# Patient Record
Sex: Male | Born: 1950 | Race: White | Hispanic: No | Marital: Married | Smoking: Never smoker
Health system: Southern US, Community
[De-identification: ages and names within clinical notes are randomized; demographics above are authoritative.]

## PROBLEM LIST (undated history)

## (undated) DIAGNOSIS — M1990 Unspecified osteoarthritis, unspecified site: Secondary | ICD-10-CM

## (undated) DIAGNOSIS — T4145XA Adverse effect of unspecified anesthetic, initial encounter: Secondary | ICD-10-CM

## (undated) DIAGNOSIS — I2510 Atherosclerotic heart disease of native coronary artery without angina pectoris: Secondary | ICD-10-CM

## (undated) DIAGNOSIS — Z87442 Personal history of urinary calculi: Secondary | ICD-10-CM

## (undated) DIAGNOSIS — K219 Gastro-esophageal reflux disease without esophagitis: Secondary | ICD-10-CM

## (undated) DIAGNOSIS — I10 Essential (primary) hypertension: Secondary | ICD-10-CM

## (undated) DIAGNOSIS — Z9889 Other specified postprocedural states: Secondary | ICD-10-CM

## (undated) DIAGNOSIS — T8859XA Other complications of anesthesia, initial encounter: Secondary | ICD-10-CM

## (undated) DIAGNOSIS — G4730 Sleep apnea, unspecified: Secondary | ICD-10-CM

## (undated) DIAGNOSIS — J45909 Unspecified asthma, uncomplicated: Secondary | ICD-10-CM

## (undated) DIAGNOSIS — E119 Type 2 diabetes mellitus without complications: Secondary | ICD-10-CM

## (undated) DIAGNOSIS — E7800 Pure hypercholesterolemia, unspecified: Secondary | ICD-10-CM

## (undated) DIAGNOSIS — R112 Nausea with vomiting, unspecified: Secondary | ICD-10-CM

## (undated) HISTORY — PX: CARDIAC CATHETERIZATION: SHX172

## (undated) HISTORY — PX: SHOULDER ARTHROSCOPY W/ ROTATOR CUFF REPAIR: SHX2400

---

## 1996-05-08 HISTORY — PX: MENISCUS REPAIR: SHX5179

## 2017-03-09 ENCOUNTER — Other Ambulatory Visit: Payer: Self-pay | Admitting: Orthopedic Surgery

## 2017-03-09 DIAGNOSIS — M1712 Unilateral primary osteoarthritis, left knee: Secondary | ICD-10-CM

## 2017-03-15 ENCOUNTER — Ambulatory Visit
Admission: RE | Admit: 2017-03-15 | Discharge: 2017-03-15 | Disposition: A | Discharge status: Home / Self Care | Payer: Medicare HMO | Source: Ambulatory Visit | Attending: Orthopedic Surgery | Admitting: Orthopedic Surgery

## 2017-03-15 DIAGNOSIS — M25462 Effusion, left knee: Secondary | ICD-10-CM | POA: Diagnosis not present

## 2017-03-15 DIAGNOSIS — M1712 Unilateral primary osteoarthritis, left knee: Secondary | ICD-10-CM | POA: Insufficient documentation

## 2017-03-15 DIAGNOSIS — M7122 Synovial cyst of popliteal space [Baker], left knee: Secondary | ICD-10-CM | POA: Diagnosis not present

## 2017-03-28 ENCOUNTER — Other Ambulatory Visit: Payer: Self-pay

## 2017-03-28 ENCOUNTER — Encounter
Admission: RE | Admit: 2017-03-28 | Discharge: 2017-03-28 | Disposition: A | Discharge status: Home / Self Care | Payer: Medicare HMO | Source: Ambulatory Visit | Attending: Orthopedic Surgery | Admitting: Orthopedic Surgery

## 2017-03-28 ENCOUNTER — Other Ambulatory Visit: Payer: Medicare HMO

## 2017-03-28 DIAGNOSIS — I10 Essential (primary) hypertension: Secondary | ICD-10-CM | POA: Insufficient documentation

## 2017-03-28 DIAGNOSIS — E119 Type 2 diabetes mellitus without complications: Secondary | ICD-10-CM | POA: Diagnosis not present

## 2017-03-28 DIAGNOSIS — Z01818 Encounter for other preprocedural examination: Secondary | ICD-10-CM | POA: Insufficient documentation

## 2017-03-28 DIAGNOSIS — I2510 Atherosclerotic heart disease of native coronary artery without angina pectoris: Secondary | ICD-10-CM | POA: Insufficient documentation

## 2017-03-28 HISTORY — DX: Atherosclerotic heart disease of native coronary artery without angina pectoris: I25.10

## 2017-03-28 HISTORY — DX: Personal history of urinary calculi: Z87.442

## 2017-03-28 HISTORY — DX: Other specified postprocedural states: Z98.890

## 2017-03-28 HISTORY — DX: Type 2 diabetes mellitus without complications: E11.9

## 2017-03-28 HISTORY — DX: Unspecified asthma, uncomplicated: J45.909

## 2017-03-28 HISTORY — DX: Essential (primary) hypertension: I10

## 2017-03-28 HISTORY — DX: Sleep apnea, unspecified: G47.30

## 2017-03-28 HISTORY — DX: Adverse effect of unspecified anesthetic, initial encounter: T41.45XA

## 2017-03-28 HISTORY — DX: Other specified postprocedural states: R11.2

## 2017-03-28 HISTORY — DX: Pure hypercholesterolemia, unspecified: E78.00

## 2017-03-28 HISTORY — DX: Other complications of anesthesia, initial encounter: T88.59XA

## 2017-03-28 HISTORY — DX: Gastro-esophageal reflux disease without esophagitis: K21.9

## 2017-03-28 HISTORY — DX: Unspecified osteoarthritis, unspecified site: M19.90

## 2017-03-28 LAB — CBC
HEMATOCRIT: 48.7 % (ref 40.0–52.0)
Hemoglobin: 16.2 g/dL (ref 13.0–18.0)
MCH: 31 pg (ref 26.0–34.0)
MCHC: 33.3 g/dL (ref 32.0–36.0)
MCV: 92.9 fL (ref 80.0–100.0)
Platelets: 256 10*3/uL (ref 150–440)
RBC: 5.24 MIL/uL (ref 4.40–5.90)
RDW: 13.7 % (ref 11.5–14.5)
WBC: 7 10*3/uL (ref 3.8–10.6)

## 2017-03-28 LAB — URINALYSIS, ROUTINE W REFLEX MICROSCOPIC
BILIRUBIN URINE: NEGATIVE
Bacteria, UA: NONE SEEN
Hgb urine dipstick: NEGATIVE
KETONES UR: 5 mg/dL — AB
LEUKOCYTES UA: NEGATIVE
NITRITE: NEGATIVE
PH: 5 (ref 5.0–8.0)
Protein, ur: 30 mg/dL — AB
RBC / HPF: NONE SEEN RBC/hpf (ref 0–5)
SPECIFIC GRAVITY, URINE: 1.032 — AB (ref 1.005–1.030)

## 2017-03-28 LAB — BASIC METABOLIC PANEL
ANION GAP: 13 (ref 5–15)
BUN: 19 mg/dL (ref 6–20)
CHLORIDE: 103 mmol/L (ref 101–111)
CO2: 20 mmol/L — AB (ref 22–32)
Calcium: 8.8 mg/dL — ABNORMAL LOW (ref 8.9–10.3)
Creatinine, Ser: 0.83 mg/dL (ref 0.61–1.24)
GFR calc non Af Amer: >60 mL/min (ref >60)
GLUCOSE: 192 mg/dL — AB (ref 65–99)
Potassium: 4.2 mmol/L (ref 3.5–5.1)
Sodium: 136 mmol/L (ref 135–145)

## 2017-03-28 LAB — TYPE AND SCREEN
ABO/RH(D): A NEG
Antibody Screen: NEGATIVE

## 2017-03-28 LAB — SURGICAL PCR SCREEN
MRSA, PCR: NEGATIVE
STAPHYLOCOCCUS AUREUS: NEGATIVE

## 2017-03-28 LAB — PROTIME-INR
INR: 0.95
Prothrombin Time: 12.6 seconds (ref 11.4–15.2)

## 2017-03-28 LAB — SEDIMENTATION RATE: Sed Rate: 1 mm/hr (ref 0–20)

## 2017-03-28 LAB — APTT: APTT: 33 s (ref 24–36)

## 2017-03-28 NOTE — Patient Instructions (Signed)
  Your procedure is scheduled JX:BJYNWGNon:Tuesday Dec. 4, 2018. Report to Same Day Surgery. To find out your arrival time please call [phone] between 1PM - 3PM on Monday Dec. 3, 2018 .  Remember: Instructions that are not followed completely may result in serious medical risk, up to and including death, or upon the discretion of your surgeon and anesthesiologist your surgery may need to be rescheduled.    _x___ 1. Do not eat food after midnight night prior to surgery. No gum   chewing or hard candies, snacks or breakfast.    May drink the following: water up until 2 hours prior to ARRIVAL time.     _x___ 2. No Alcohol for 24 hours before or after surgery.   ____ 3. Bring all medications with you on the day of surgery if instructed.    __x__ 4. Notify your doctor if there is any change in your medical condition     (cold, fever, infections).    _____ 5.   Do Not Smoke or use e-cigarettes For 24 Hours Prior to Your   Surgery.  Do not use any chewable tobacco products for at least 6   hours prior to  surgery.                      Do not wear jewelry, make-up, hairpins, clips or nail polish.  Do not wear lotions, powders, or perfumes.   Do not shave 48 hours prior to surgery. Men may shave face and neck.  Do not bring valuables to the hospital.    [facility] is not responsible for any belongings or valuables.               Contacts, dentures or bridgework may not be worn into surgery.  Leave your suitcase in the car. After surgery it may be brought to your room.  For patients admitted to the hospital, discharge time is determined by your  treatment team.   Patients discharged the day of surgery will not be allowed to drive home.    Please read over the following fact sheets that you were given:   [facility]  _x___ Take these medicines the morning of surgery with A SIP OF WATER:    1. amLODipine (NORVASC)  2. metoprolol succinate (TOPROL-XL)   3. omeprazole  (PRILOSEC) also take an extra dose at bedtime the night prior to surgery       ____ Fleet Enema (as directed)   _x___ Use CHG Soap as directed on instruction sheet  _x___ Use inhalers on the day of surgery and bring to hospital day of surgery  _x___ Stop metformin 2 days prior to surgery    ____ Take 1/2 of usual insulin dose the night before surgery and none on the morning of  surgery.   __x_ Stop aspirin as instructed by your cardiologist.  __x__ Stop Anti-inflammatories such as Advil, Aleve, Ibuprofen, Motrin, Naproxen,  Naprosyn, Goodies powders or aspirin products. OK to take Tylenol.   _x___ Stop supplements: Omega-3  until after surgery.    __x__ Bring C-Pap to the hospital.

## 2017-03-29 LAB — URINE CULTURE: Culture: NO GROWTH

## 2017-04-09 MED ORDER — TRANEXAMIC ACID 1000 MG/10ML IV SOLN
1000.0000 mg | INTRAVENOUS | Status: AC
  Administered 2017-04-10: 1000 mg via INTRAVENOUS
  Filled 2017-04-09: qty 10

## 2017-04-10 ENCOUNTER — Inpatient Hospital Stay: Payer: Medicare HMO | Admitting: Certified Registered"

## 2017-04-10 ENCOUNTER — Encounter: Payer: Self-pay | Admitting: *Deleted

## 2017-04-10 ENCOUNTER — Encounter
Admission: RE | Disposition: A | Discharge status: Home Health | Payer: Self-pay | Source: Ambulatory Visit | Attending: Orthopedic Surgery

## 2017-04-10 ENCOUNTER — Inpatient Hospital Stay
Admission: RE | Admit: 2017-04-10 | Discharge: 2017-04-13 | DRG: 470 | Disposition: A | Discharge status: Home Health | Payer: Medicare HMO | Source: Ambulatory Visit | Attending: Orthopedic Surgery | Admitting: Orthopedic Surgery

## 2017-04-10 ENCOUNTER — Inpatient Hospital Stay: Payer: Medicare HMO

## 2017-04-10 ENCOUNTER — Other Ambulatory Visit: Payer: Self-pay

## 2017-04-10 DIAGNOSIS — I10 Essential (primary) hypertension: Secondary | ICD-10-CM | POA: Diagnosis present

## 2017-04-10 DIAGNOSIS — G4730 Sleep apnea, unspecified: Secondary | ICD-10-CM | POA: Diagnosis present

## 2017-04-10 DIAGNOSIS — I2510 Atherosclerotic heart disease of native coronary artery without angina pectoris: Secondary | ICD-10-CM | POA: Diagnosis present

## 2017-04-10 DIAGNOSIS — Z79899 Other long term (current) drug therapy: Secondary | ICD-10-CM | POA: Diagnosis not present

## 2017-04-10 DIAGNOSIS — E7800 Pure hypercholesterolemia, unspecified: Secondary | ICD-10-CM | POA: Diagnosis present

## 2017-04-10 DIAGNOSIS — M1712 Unilateral primary osteoarthritis, left knee: Principal | ICD-10-CM | POA: Diagnosis present

## 2017-04-10 DIAGNOSIS — J45909 Unspecified asthma, uncomplicated: Secondary | ICD-10-CM | POA: Diagnosis present

## 2017-04-10 DIAGNOSIS — E119 Type 2 diabetes mellitus without complications: Secondary | ICD-10-CM | POA: Diagnosis present

## 2017-04-10 DIAGNOSIS — G8918 Other acute postprocedural pain: Secondary | ICD-10-CM

## 2017-04-10 DIAGNOSIS — K219 Gastro-esophageal reflux disease without esophagitis: Secondary | ICD-10-CM | POA: Diagnosis present

## 2017-04-10 DIAGNOSIS — M25562 Pain in left knee: Secondary | ICD-10-CM | POA: Diagnosis present

## 2017-04-10 HISTORY — PX: TOTAL KNEE ARTHROPLASTY: SHX125

## 2017-04-10 LAB — CREATININE, SERUM
CREATININE: 0.9 mg/dL (ref 0.61–1.24)
GFR calc Af Amer: >60 mL/min (ref >60)

## 2017-04-10 LAB — CBC
HCT: 45.7 % (ref 40.0–52.0)
HEMOGLOBIN: 15.2 g/dL (ref 13.0–18.0)
MCH: 30.7 pg (ref 26.0–34.0)
MCHC: 33.3 g/dL (ref 32.0–36.0)
MCV: 92.3 fL (ref 80.0–100.0)
PLATELETS: 235 10*3/uL (ref 150–440)
RBC: 4.95 MIL/uL (ref 4.40–5.90)
RDW: 14.2 % (ref 11.5–14.5)
WBC: 11 10*3/uL — ABNORMAL HIGH (ref 3.8–10.6)

## 2017-04-10 LAB — GLUCOSE, CAPILLARY
GLUCOSE-CAPILLARY: 167 mg/dL — AB (ref 65–99)
Glucose-Capillary: 177 mg/dL — ABNORMAL HIGH (ref 65–99)

## 2017-04-10 LAB — ABO/RH: ABO/RH(D): A NEG

## 2017-04-10 SURGERY — ARTHROPLASTY, KNEE, TOTAL
Anesthesia: Spinal | Site: Knee | Laterality: Left | Wound class: Clean

## 2017-04-10 MED ORDER — NEOMYCIN-POLYMYXIN B GU 40-200000 IR SOLN
Status: AC
  Filled 2017-04-10: qty 20

## 2017-04-10 MED ORDER — CEFAZOLIN SODIUM-DEXTROSE 2-4 GM/100ML-% IV SOLN
INTRAVENOUS | Status: AC
  Filled 2017-04-10: qty 100

## 2017-04-10 MED ORDER — BISACODYL 10 MG RE SUPP
10.0000 mg | Freq: Every day | RECTAL | Status: DC | PRN
  Administered 2017-04-13: 10 mg via RECTAL
  Filled 2017-04-10: qty 1

## 2017-04-10 MED ORDER — METOCLOPRAMIDE HCL 5 MG/ML IJ SOLN: 5.0000 mg | Freq: Three times a day (TID) | INTRAMUSCULAR | Status: DC | PRN

## 2017-04-10 MED ORDER — MENTHOL 3 MG MT LOZG
1.0000 | LOZENGE | OROMUCOSAL | Status: DC | PRN
  Filled 2017-04-10: qty 9

## 2017-04-10 MED ORDER — ZOLPIDEM TARTRATE 5 MG PO TABS
5.0000 mg | ORAL_TABLET | Freq: Every evening | ORAL | Status: DC | PRN
  Administered 2017-04-10 – 2017-04-12 (×3): 5 mg via ORAL
  Filled 2017-04-10 (×3): qty 1

## 2017-04-10 MED ORDER — DEXTROSE 5 % IV SOLN
2.0000 g | Freq: Four times a day (QID) | INTRAVENOUS | Status: AC
  Administered 2017-04-10 (×2): 2 g via INTRAVENOUS
  Filled 2017-04-10 (×3): qty 2000

## 2017-04-10 MED ORDER — SODIUM CHLORIDE 0.9 % IV SOLN
INTRAVENOUS | Status: DC
  Administered 2017-04-10 – 2017-04-11 (×2): via INTRAVENOUS

## 2017-04-10 MED ORDER — BUPIVACAINE HCL (PF) 0.5 % IJ SOLN
INTRAMUSCULAR | Status: AC
  Filled 2017-04-10: qty 10

## 2017-04-10 MED ORDER — PHENOL 1.4 % MT LIQD
1.0000 | OROMUCOSAL | Status: DC | PRN
  Filled 2017-04-10: qty 177

## 2017-04-10 MED ORDER — PROPOFOL 500 MG/50ML IV EMUL
INTRAVENOUS | Status: DC | PRN
  Administered 2017-04-10: 25 ug/kg/min via INTRAVENOUS

## 2017-04-10 MED ORDER — EPINEPHRINE PF 1 MG/ML IJ SOLN
INTRAMUSCULAR | Status: AC
  Filled 2017-04-10: qty 1

## 2017-04-10 MED ORDER — ATORVASTATIN CALCIUM 20 MG PO TABS
80.0000 mg | ORAL_TABLET | Freq: Every day | ORAL | Status: DC
  Administered 2017-04-10 – 2017-04-12 (×3): 80 mg via ORAL
  Filled 2017-04-10 (×2): qty 4

## 2017-04-10 MED ORDER — LISINOPRIL 20 MG PO TABS
40.0000 mg | ORAL_TABLET | Freq: Every day | ORAL | Status: DC
  Administered 2017-04-11 – 2017-04-13 (×3): 40 mg via ORAL
  Filled 2017-04-10 (×3): qty 2

## 2017-04-10 MED ORDER — MAGNESIUM HYDROXIDE 400 MG/5ML PO SUSP
30.0000 mL | Freq: Every day | ORAL | Status: DC | PRN
  Administered 2017-04-11 – 2017-04-12 (×2): 30 mL via ORAL
  Filled 2017-04-10 (×2): qty 30

## 2017-04-10 MED ORDER — ONDANSETRON HCL 4 MG PO TABS
4.0000 mg | ORAL_TABLET | Freq: Four times a day (QID) | ORAL | Status: DC | PRN
  Administered 2017-04-11: 4 mg via ORAL
  Filled 2017-04-10: qty 1

## 2017-04-10 MED ORDER — METOPROLOL SUCCINATE ER 25 MG PO TB24
12.5000 mg | ORAL_TABLET | Freq: Every day | ORAL | Status: DC
  Administered 2017-04-11 – 2017-04-13 (×3): 12.5 mg via ORAL
  Filled 2017-04-10 (×3): qty 1

## 2017-04-10 MED ORDER — GLIPIZIDE ER 10 MG PO TB24
10.0000 mg | ORAL_TABLET | Freq: Every day | ORAL | Status: DC
  Administered 2017-04-11 – 2017-04-13 (×3): 10 mg via ORAL
  Filled 2017-04-10 (×3): qty 1

## 2017-04-10 MED ORDER — VITAMIN C 500 MG PO TABS
500.0000 mg | ORAL_TABLET | Freq: Every day | ORAL | Status: DC
  Administered 2017-04-10 – 2017-04-13 (×4): 500 mg via ORAL
  Filled 2017-04-10 (×4): qty 1

## 2017-04-10 MED ORDER — SODIUM CHLORIDE 0.9 % IV SOLN
INTRAVENOUS | Status: DC
  Administered 2017-04-10 (×2): via INTRAVENOUS

## 2017-04-10 MED ORDER — MIDAZOLAM HCL 2 MG/2ML IJ SOLN
INTRAMUSCULAR | Status: AC
  Filled 2017-04-10: qty 2

## 2017-04-10 MED ORDER — LIDOCAINE HCL (PF) 2 % IJ SOLN
INTRAMUSCULAR | Status: DC | PRN
  Administered 2017-04-10: 25 mg

## 2017-04-10 MED ORDER — GLIPIZIDE ER 5 MG PO TB24
5.0000 mg | ORAL_TABLET | Freq: Every day | ORAL | Status: DC
  Administered 2017-04-10 – 2017-04-12 (×3): 5 mg via ORAL
  Filled 2017-04-10 (×4): qty 1

## 2017-04-10 MED ORDER — GLIPIZIDE ER 5 MG PO TB24: 5.0000 mg | ORAL_TABLET | ORAL | Status: DC

## 2017-04-10 MED ORDER — METOCLOPRAMIDE HCL 10 MG PO TABS: 5.0000 mg | ORAL_TABLET | Freq: Three times a day (TID) | ORAL | Status: DC | PRN

## 2017-04-10 MED ORDER — OMEGA-3-ACID ETHYL ESTERS 1 G PO CAPS
1.0000 g | ORAL_CAPSULE | Freq: Every day | ORAL | Status: DC
  Administered 2017-04-11 – 2017-04-13 (×3): 1 g via ORAL
  Filled 2017-04-10 (×3): qty 1

## 2017-04-10 MED ORDER — FENTANYL CITRATE (PF) 100 MCG/2ML IJ SOLN
INTRAMUSCULAR | Status: DC | PRN
  Administered 2017-04-10 (×2): 50 ug via INTRAVENOUS

## 2017-04-10 MED ORDER — ALBUTEROL SULFATE (2.5 MG/3ML) 0.083% IN NEBU: 2.5000 mg | INHALATION_SOLUTION | RESPIRATORY_TRACT | Status: DC | PRN

## 2017-04-10 MED ORDER — DOCUSATE SODIUM 100 MG PO CAPS
100.0000 mg | ORAL_CAPSULE | Freq: Two times a day (BID) | ORAL | Status: DC
  Administered 2017-04-10 – 2017-04-13 (×7): 100 mg via ORAL
  Filled 2017-04-10 (×7): qty 1

## 2017-04-10 MED ORDER — METHOCARBAMOL 500 MG PO TABS
500.0000 mg | ORAL_TABLET | Freq: Four times a day (QID) | ORAL | Status: DC | PRN
  Administered 2017-04-12: 500 mg via ORAL
  Filled 2017-04-10: qty 1

## 2017-04-10 MED ORDER — FENTANYL CITRATE (PF) 100 MCG/2ML IJ SOLN
INTRAMUSCULAR | Status: AC
  Filled 2017-04-10: qty 2

## 2017-04-10 MED ORDER — HYDROCODONE-ACETAMINOPHEN 5-325 MG PO TABS
1.0000 | ORAL_TABLET | ORAL | Status: DC | PRN
  Administered 2017-04-10 – 2017-04-12 (×8): 1 via ORAL
  Filled 2017-04-10 (×8): qty 1

## 2017-04-10 MED ORDER — METHOCARBAMOL 1000 MG/10ML IJ SOLN
500.0000 mg | Freq: Four times a day (QID) | INTRAVENOUS | Status: DC | PRN
  Filled 2017-04-10: qty 5

## 2017-04-10 MED ORDER — MAGNESIUM CITRATE PO SOLN
1.0000 | Freq: Once | ORAL | Status: DC | PRN
Start: 2017-04-10 — End: 2017-04-13
  Filled 2017-04-10: qty 296

## 2017-04-10 MED ORDER — MORPHINE SULFATE 10 MG/ML IJ SOLN
INTRAMUSCULAR | Status: DC | PRN
  Administered 2017-04-10: 10 mg

## 2017-04-10 MED ORDER — MIDAZOLAM HCL 5 MG/5ML IJ SOLN
INTRAMUSCULAR | Status: DC | PRN
Start: 2017-04-10 — End: 2017-04-10
  Administered 2017-04-10 (×2): 2 mg via INTRAVENOUS

## 2017-04-10 MED ORDER — DEXTROSE 5 % IV SOLN
2.0000 g | Freq: Once | INTRAVENOUS | Status: DC
  Filled 2017-04-10: qty 20

## 2017-04-10 MED ORDER — METFORMIN HCL 500 MG PO TABS
1000.0000 mg | ORAL_TABLET | Freq: Two times a day (BID) | ORAL | Status: DC
  Administered 2017-04-10 – 2017-04-13 (×6): 1000 mg via ORAL
  Filled 2017-04-10 (×7): qty 2

## 2017-04-10 MED ORDER — PROPOFOL 10 MG/ML IV BOLUS
INTRAVENOUS | Status: DC | PRN
  Administered 2017-04-10: 20 mg via INTRAVENOUS

## 2017-04-10 MED ORDER — ALBUTEROL SULFATE HFA 108 (90 BASE) MCG/ACT IN AERS
2.0000 | INHALATION_SPRAY | RESPIRATORY_TRACT | Status: DC | PRN
Start: 2017-04-10 — End: 2017-04-10

## 2017-04-10 MED ORDER — AMLODIPINE BESYLATE 5 MG PO TABS
5.0000 mg | ORAL_TABLET | Freq: Every day | ORAL | Status: DC
  Administered 2017-04-11 – 2017-04-13 (×3): 5 mg via ORAL
  Filled 2017-04-10 (×3): qty 1

## 2017-04-10 MED ORDER — FENTANYL CITRATE (PF) 100 MCG/2ML IJ SOLN: 25.0000 ug | INTRAMUSCULAR | Status: DC | PRN

## 2017-04-10 MED ORDER — LORATADINE 10 MG PO TABS
10.0000 mg | ORAL_TABLET | Freq: Every day | ORAL | Status: DC
  Administered 2017-04-10 – 2017-04-13 (×4): 10 mg via ORAL
  Filled 2017-04-10 (×4): qty 1

## 2017-04-10 MED ORDER — GLYCOPYRROLATE 0.2 MG/ML IJ SOLN
INTRAMUSCULAR | Status: AC
  Filled 2017-04-10: qty 1

## 2017-04-10 MED ORDER — ONDANSETRON HCL 4 MG/2ML IJ SOLN
4.0000 mg | Freq: Four times a day (QID) | INTRAMUSCULAR | Status: DC | PRN
  Administered 2017-04-10: 4 mg via INTRAVENOUS
  Filled 2017-04-10: qty 2

## 2017-04-10 MED ORDER — ONDANSETRON HCL 4 MG/2ML IJ SOLN: 4.0000 mg | Freq: Once | INTRAMUSCULAR | Status: DC | PRN

## 2017-04-10 MED ORDER — CEFAZOLIN SODIUM-DEXTROSE 2-4 GM/100ML-% IV SOLN
2.0000 g | Freq: Four times a day (QID) | INTRAVENOUS | Status: DC
  Filled 2017-04-10 (×2): qty 100

## 2017-04-10 MED ORDER — CEFAZOLIN SODIUM-DEXTROSE 2-3 GM-%(50ML) IV SOLR
INTRAVENOUS | Status: DC | PRN
  Administered 2017-04-10: 2 g via INTRAVENOUS

## 2017-04-10 MED ORDER — HYDROCODONE-ACETAMINOPHEN 10-325 MG PO TABS
2.0000 | ORAL_TABLET | ORAL | Status: DC | PRN
  Administered 2017-04-12 – 2017-04-13 (×5): 2 via ORAL
  Filled 2017-04-10 (×6): qty 2

## 2017-04-10 MED ORDER — NEOMYCIN-POLYMYXIN B GU 40-200000 IR SOLN
Status: DC | PRN
  Administered 2017-04-10: 16 mL

## 2017-04-10 MED ORDER — BUPIVACAINE HCL (PF) 0.5 % IJ SOLN
INTRAMUSCULAR | Status: DC | PRN
  Administered 2017-04-10: 3 mL via INTRATHECAL

## 2017-04-10 MED ORDER — HYDROCODONE-ACETAMINOPHEN 10-325 MG PO TABS
1.0000 | ORAL_TABLET | Freq: Once | ORAL | Status: AC
Start: 2017-04-10 — End: 2017-04-10
  Administered 2017-04-10: 1 via ORAL

## 2017-04-10 MED ORDER — PANTOPRAZOLE SODIUM 40 MG PO TBEC
40.0000 mg | DELAYED_RELEASE_TABLET | Freq: Every day | ORAL | Status: DC
Start: 2017-04-11 — End: 2017-04-13
  Administered 2017-04-11 – 2017-04-13 (×3): 40 mg via ORAL
  Filled 2017-04-10 (×3): qty 1

## 2017-04-10 MED ORDER — DIPHENHYDRAMINE HCL 12.5 MG/5ML PO ELIX: 12.5000 mg | ORAL_SOLUTION | ORAL | Status: DC | PRN

## 2017-04-10 MED ORDER — SODIUM CHLORIDE 0.9 % IJ SOLN
INTRAMUSCULAR | Status: AC
  Filled 2017-04-10: qty 50

## 2017-04-10 MED ORDER — FLUTICASONE PROPIONATE 50 MCG/ACT NA SUSP
2.0000 | Freq: Every day | NASAL | Status: DC | PRN
  Administered 2017-04-12: 2 via NASAL
  Filled 2017-04-10: qty 16

## 2017-04-10 MED ORDER — NITROGLYCERIN 0.4 MG SL SUBL: 0.4000 mg | SUBLINGUAL_TABLET | SUBLINGUAL | Status: DC | PRN

## 2017-04-10 MED ORDER — ACETAMINOPHEN 650 MG RE SUPP: 650.0000 mg | RECTAL | Status: DC | PRN

## 2017-04-10 MED ORDER — BUPIVACAINE-EPINEPHRINE (PF) 0.25% -1:200000 IJ SOLN
INTRAMUSCULAR | Status: DC | PRN
  Administered 2017-04-10: 30 mL

## 2017-04-10 MED ORDER — SODIUM CHLORIDE 0.9 % IV SOLN
INTRAVENOUS | Status: DC | PRN
  Administered 2017-04-10: 60 mL

## 2017-04-10 MED ORDER — PROPOFOL 500 MG/50ML IV EMUL
INTRAVENOUS | Status: AC
  Filled 2017-04-10: qty 50

## 2017-04-10 MED ORDER — GLYCOPYRROLATE 0.2 MG/ML IJ SOLN
INTRAMUSCULAR | Status: DC | PRN
  Administered 2017-04-10: 0.2 mg via INTRAVENOUS

## 2017-04-10 MED ORDER — KETOROLAC TROMETHAMINE 30 MG/ML IJ SOLN
INTRAMUSCULAR | Status: DC | PRN
  Administered 2017-04-10: 30 mg

## 2017-04-10 MED ORDER — CYCLOBENZAPRINE HCL 10 MG PO TABS
10.0000 mg | ORAL_TABLET | Freq: Every day | ORAL | Status: DC
  Administered 2017-04-10 – 2017-04-13 (×4): 10 mg via ORAL
  Filled 2017-04-10 (×4): qty 1

## 2017-04-10 MED ORDER — MORPHINE SULFATE (PF) 2 MG/ML IV SOLN: 2.0000 mg | INTRAVENOUS | Status: DC | PRN

## 2017-04-10 MED ORDER — ACETAMINOPHEN 325 MG PO TABS: 650.0000 mg | ORAL_TABLET | ORAL | Status: DC | PRN

## 2017-04-10 MED ORDER — ASPIRIN EC 81 MG PO TBEC
81.0000 mg | DELAYED_RELEASE_TABLET | Freq: Every day | ORAL | Status: DC
  Administered 2017-04-10 – 2017-04-13 (×4): 81 mg via ORAL
  Filled 2017-04-10 (×4): qty 1

## 2017-04-10 MED ORDER — ALUM & MAG HYDROXIDE-SIMETH 200-200-20 MG/5ML PO SUSP: 30.0000 mL | ORAL | Status: DC | PRN

## 2017-04-10 MED ORDER — ENOXAPARIN SODIUM 30 MG/0.3ML ~~LOC~~ SOLN
30.0000 mg | Freq: Two times a day (BID) | SUBCUTANEOUS | Status: DC
  Administered 2017-04-11 – 2017-04-13 (×5): 30 mg via SUBCUTANEOUS
  Filled 2017-04-10 (×5): qty 0.3

## 2017-04-10 MED ORDER — EPINEPHRINE PF 1 MG/ML IJ SOLN
INTRAMUSCULAR | Status: DC | PRN
  Administered 2017-04-10: .0001 mL via INTRATHECAL

## 2017-04-10 MED ORDER — BUPIVACAINE LIPOSOME 1.3 % IJ SUSP
INTRAMUSCULAR | Status: AC
  Filled 2017-04-10: qty 20

## 2017-04-10 SURGICAL SUPPLY — 62 items
BANDAGE ACE 6X5 VEL STRL LF (GAUZE/BANDAGES/DRESSINGS) ×3 IMPLANT
BLADE SAW 1 (BLADE) ×3 IMPLANT
BLADE SAW 1/2 (BLADE) ×3 IMPLANT
BLOCK CUTTING FEMUR 3+ LT MED (MISCELLANEOUS) IMPLANT
BLOCK CUTTING TIBIAL 3 LT (MISCELLANEOUS) IMPLANT
CANISTER SUCT 1200ML W/VALVE (MISCELLANEOUS) ×3 IMPLANT
CANISTER SUCT 3000ML PPV (MISCELLANEOUS) ×6 IMPLANT
CAPT KNEE TOTAL 3 ×3 IMPLANT
CEMENT HV SMART SET (Cement) ×6 IMPLANT
CHLORAPREP W/TINT 26ML (MISCELLANEOUS) ×6 IMPLANT
COOLER POLAR GLACIER W/PUMP (MISCELLANEOUS) ×3 IMPLANT
CUFF TOURN 24 STER (MISCELLANEOUS) ×3 IMPLANT
CUFF TOURN 30 STER DUAL PORT (MISCELLANEOUS) IMPLANT
DRAPE SHEET LG 3/4 BI-LAMINATE (DRAPES) ×9 IMPLANT
ELECT CAUTERY BLADE 6.4 (BLADE) ×3 IMPLANT
ELECT REM PT RETURN 9FT ADLT (ELECTROSURGICAL) ×3
ELECTRODE REM PT RTRN 9FT ADLT (ELECTROSURGICAL) ×1 IMPLANT
GAUZE PETRO XEROFOAM 1X8 (MISCELLANEOUS) ×3 IMPLANT
GAUZE SPONGE 4X4 12PLY STRL (GAUZE/BANDAGES/DRESSINGS) ×3 IMPLANT
GLOVE BIOGEL PI IND STRL 9 (GLOVE) ×1 IMPLANT
GLOVE BIOGEL PI INDICATOR 9 (GLOVE) ×2
GLOVE INDICATOR 8.0 STRL GRN (GLOVE) ×3 IMPLANT
GLOVE SURG ORTHO 8.0 STRL STRW (GLOVE) ×3 IMPLANT
GLOVE SURG SYN 9.0  PF PI (GLOVE) ×2
GLOVE SURG SYN 9.0 PF PI (GLOVE) ×1 IMPLANT
GOWN SRG 2XL LVL 4 RGLN SLV (GOWNS) ×1 IMPLANT
GOWN STRL NON-REIN 2XL LVL4 (GOWNS) ×2
GOWN STRL REUS W/ TWL LRG LVL3 (GOWN DISPOSABLE) ×1 IMPLANT
GOWN STRL REUS W/ TWL XL LVL3 (GOWN DISPOSABLE) ×1 IMPLANT
GOWN STRL REUS W/TWL LRG LVL3 (GOWN DISPOSABLE) ×2
GOWN STRL REUS W/TWL XL LVL3 (GOWN DISPOSABLE) ×2
HOLDER FOLEY CATH W/STRAP (MISCELLANEOUS) ×3 IMPLANT
HOOD PEEL AWAY FLYTE STAYCOOL (MISCELLANEOUS) ×6 IMPLANT
IMMBOLIZER KNEE 19 BLUE UNIV (SOFTGOODS) ×3 IMPLANT
KIT RM TURNOVER STRD PROC AR (KITS) ×3 IMPLANT
KNEE MEDACTA TIBIAL/FEMORAL BL (Knees) ×3 IMPLANT
KNIFE SCULPS 14X20 (INSTRUMENTS) ×3 IMPLANT
MEDACTA MY KNEE FEMUR BONE MODEL IMPLANT
NDL SAFETY ECLIPSE 18X1.5 (NEEDLE) ×1 IMPLANT
NEEDLE HYPO 18GX1.5 SHARP (NEEDLE) ×2
NEEDLE SPNL 18GX3.5 QUINCKE PK (NEEDLE) ×3 IMPLANT
NEEDLE SPNL 20GX3.5 QUINCKE YW (NEEDLE) ×3 IMPLANT
NS IRRIG 1000ML POUR BTL (IV SOLUTION) ×3 IMPLANT
PACK TOTAL KNEE (MISCELLANEOUS) ×3 IMPLANT
PAD WRAPON POLAR KNEE (MISCELLANEOUS) ×1 IMPLANT
PULSAVAC PLUS IRRIG FAN TIP (DISPOSABLE) ×3
SOL .9 NS 3000ML IRR  AL (IV SOLUTION) ×2
SOL .9 NS 3000ML IRR UROMATIC (IV SOLUTION) ×1 IMPLANT
STAPLER SKIN PROX 35W (STAPLE) ×3 IMPLANT
SUCTION FRAZIER HANDLE 10FR (MISCELLANEOUS) ×2
SUCTION TUBE FRAZIER 10FR DISP (MISCELLANEOUS) ×1 IMPLANT
SUT DVC 2 QUILL PDO  T11 36X36 (SUTURE) ×2
SUT DVC 2 QUILL PDO T11 36X36 (SUTURE) ×1 IMPLANT
SUT V-LOC 90 ABS DVC 3-0 CL (SUTURE) ×3 IMPLANT
SYR 20CC LL (SYRINGE) ×3 IMPLANT
SYR 50ML LL SCALE MARK (SYRINGE) ×6 IMPLANT
TIBIAL BONE MODEL LEFT (MISCELLANEOUS) IMPLANT
TIP FAN IRRIG PULSAVAC PLUS (DISPOSABLE) ×1 IMPLANT
TOWEL OR 17X26 4PK STRL BLUE (TOWEL DISPOSABLE) ×3 IMPLANT
TOWER CARTRIDGE SMART MIX (DISPOSABLE) ×3 IMPLANT
TRAY FOLEY W/METER SILVER 16FR (SET/KITS/TRAYS/PACK) ×3 IMPLANT
WRAPON POLAR PAD KNEE (MISCELLANEOUS) ×3

## 2017-04-10 NOTE — Progress Notes (Signed)
Pt complaints on severe calf pain. Bone foam removed and pt stated pain was decreasing. Educated on importance of bone foam, and will reapply when able to tolerate. One PRN hydrocodone given.

## 2017-04-10 NOTE — Transfer of Care (Signed)
Immediate Anesthesia Transfer of Care Note  Patient: Keith King  Procedure(s) Performed: TOTAL KNEE ARTHROPLASTY (Left Knee)  Patient Location: PACU  Anesthesia Type:Spinal  Level of Consciousness: awake, alert  and oriented  Airway & Oxygen Therapy: Patient Spontanous Breathing  Post-op Assessment: Report given to RN and Post -op Vital signs reviewed and stable  Post vital signs: Reviewed  Last Vitals:  Vitals:   04/10/17 1236 04/10/17 1238  BP:  (!) 92/59  Pulse: 89   Resp: 18   Temp: 37.3 C   SpO2: 96%     Last Pain:  Vitals:   04/10/17 0836  TempSrc: Tympanic         Complications: No apparent anesthesia complications

## 2017-04-10 NOTE — H&P (Signed)
Reviewed paper H+P, will be scanned into chart. No changes noted.  

## 2017-04-10 NOTE — Anesthesia Post-op Follow-up Note (Signed)
Anesthesia QCDR form completed.        

## 2017-04-10 NOTE — Anesthesia Preprocedure Evaluation (Signed)
Anesthesia Evaluation  Patient identified by MRN, date of birth, ID band Patient awake    Reviewed: Allergy & Precautions, NPO status , Patient's Chart, lab work & pertinent test results, reviewed documented beta blocker date and time   History of Anesthesia Complications (+) PONV and history of anesthetic complications  Airway Mallampati: III  TM Distance: >3 FB     Dental  (+) Chipped, Caps   Pulmonary asthma , sleep apnea and Continuous Positive Airway Pressure Ventilation ,    Pulmonary exam normal        Cardiovascular hypertension, Pt. on medications and Pt. on home beta blockers + CAD  Normal cardiovascular exam     Neuro/Psych negative neurological ROS  negative psych ROS   GI/Hepatic Neg liver ROS, GERD  ,  Endo/Other  diabetes, Well Controlled, Type 2, Oral Hypoglycemic Agents  Renal/GU negative Renal ROS  negative genitourinary   Musculoskeletal  (+) Arthritis , Osteoarthritis,    Abdominal Normal abdominal exam  (+)   Peds negative pediatric ROS (+)  Hematology negative hematology ROS (+)   Anesthesia Other Findings   Reproductive/Obstetrics                             Anesthesia Physical Anesthesia Plan  ASA: III  Anesthesia Plan: Spinal   Post-op Pain Management:    Induction: Intravenous  PONV Risk Score and Plan:   Airway Management Planned: Nasal Cannula  Additional Equipment:   Intra-op Plan:   Post-operative Plan:   Informed Consent: I have reviewed the patients History and Physical, chart, labs and discussed the procedure including the risks, benefits and alternatives for the proposed anesthesia with the patient or authorized representative who has indicated his/her understanding and acceptance.   Dental advisory given  Plan Discussed with: CRNA and Surgeon  Anesthesia Plan Comments:         Anesthesia Quick Evaluation

## 2017-04-10 NOTE — Anesthesia Procedure Notes (Addendum)
Spinal  Patient location during procedure: OR Staffing Anesthesiologist: Alvin Critchley, MD Resident/CRNA: Rolla Plate, CRNA Performed: anesthesiologist and resident/CRNA  Preanesthetic Checklist Completed: patient identified, site marked, surgical consent, pre-op evaluation, timeout performed, IV checked, risks and benefits discussed and monitors and equipment checked Spinal Block Patient position: sitting Prep: ChloraPrep and site prepped and draped Patient monitoring: heart rate, continuous pulse ox, blood pressure and cardiac monitor Approach: midline Location: L3-4 Injection technique: single-shot Needle Needle type: Quincke  Needle gauge: 25 G Needle length: 9 cm Additional Notes Multiple attempts per CRNA and MDA. Negative paresthesia. Negative blood return. Positive free-flowing CSF. Expiration date of kit checked and confirmed. Patient tolerated procedure well, without complications.

## 2017-04-10 NOTE — Progress Notes (Signed)
Pt arrived to unit. Skin assessment completed with Irving BurtonEmily, RN. IV infusing NS. Pt on room air. Bone foam applied under left foot. Sacral dressing in place. Pt oriented to unit. Call bell in reach. Bed in lowest position. Will continue to monitor.

## 2017-04-10 NOTE — Progress Notes (Signed)
PT Cancellation Note  Patient Details Name: Keith King MRN: [id number] DOB: [birth year]   Cancelled Treatment:    Reason Eval/Treat Not Completed: Medical issues which prohibited therapy(Consult received and chart reviewed. Patient continues with persistent numbness in bilat LEs; unable actively mobilize LEs at this time. Will hold evaluation at this time and re-attempt next date.)   Keith King, PT, DPT, NCS 04/10/17, 4:08 PM [phone]

## 2017-04-10 NOTE — Progress Notes (Signed)
Dr. Noralyn Pickarroll notified that pt spinal is progressing down legs. Pt able to move knees now but not able to pick up his legs. Dr. Noralyn Pickarroll stated that he was ok with pt going to the floor as the spinal is progressing down the legs. Keith King E 2:26 PM 04/10/2017

## 2017-04-10 NOTE — Op Note (Signed)
04/10/2017  12:37 PM  PATIENT:  Keith King  66 y.o. male  PRE-OPERATIVE DIAGNOSIS:  PRIMARY OSTEOARTHRITIS OF LEFT KNEE  POST-OPERATIVE DIAGNOSIS:  PRIMARY OSTEOARTHRITIS OF LEFT KNEE  PROCEDURE:  Procedure(s): TOTAL KNEE ARTHROPLASTY (Left)  SURGEON: Leitha SchullerMichael J Malak Duchesneau, MD  ASSISTANTS: Cran[place] Neighborhris [facility]  ANESTHESIA:   spinal  EBL:  Total I/O In: 1400 [I.V.:1400] Out: 450 [Urine:350; Blood:100]  BLOOD ADMINISTERED:none  DRAINS: none   LOCAL MEDICATIONS USED:  MARCAINE    and OTHER Toradol morphine and Exparel  SPECIMEN:  No Specimen  DISPOSITION OF SPECIMEN:  N/A  COUNTS:  YES  TOURNIQUET:   Total Tourniquet Time Documented: Thigh (Left) - 23 minutes Total: Thigh (Left) - 23 minutes   IMPLANTS: Medacta GMK sphere 3+ femur, 3 tibia with short stem 12 mm insert to patella all components cemented  DICTATION: .Dragon Dictation  patient brought the operating room and after adequate spinal anesthesia was obtainedleftleg was prepped and draped in sterile fashion. After patient identification and timeout procedures were completed tourniquet was raised and midline skin incision was made followed by medial parapatellar arthrotomy. The knee revealedewear of the lateral compartment both femoral and tibial condyles with exposed bone and milder degenerative change laterally with mild synovitis as well as exposed bone in the femoral trochlea and patella centrally, milder degenerative changes to the medial compartment. . The fat pad and anterior cruciate ligament and PCL were excised and the proximal tibia cutting guide was applied to the proximal tibia and axial tibia cut carried out followed by distal femur cut. The 4-in-1 cutting guide was applied to the femur anterior posterior and chamfer cuts made. The posterior horns of the menisci could be resected this time and the size3tibia baseplate trial was placed in apporpriote rotation.. The keel punch was placed followed by the  83femoral trial and a 12 mm insert gave good stability through range of motion. This was thenchosen as the final implant. Distal femur drill holes were made followed by the trochlear groove cut and then trials were removed. The patella was affected with arthritis as welland resection was carried out drilling plate carried out and then a size 2 patella fit well. These trials were all removed and tourniquet was raised at this point with hemostasis checked with electrocautery. Thelocal anesthetic noted above was infiltrated in the para-articular tissue. The the bony surfaces thoroughly irrigated and dried. Tibial component was cemented in place first followed by the plastic insert and then the femoral component and the knee placed in extension with excess cement being removed. Next the patellar button was clamped into place and after the cement entirely set the clamp was removed the patella tracked well. .  the tourniquet was let down, the knee was thoroughly irrigated and then closed with a heavy Quill with 3-0 locking suture followed by skin staples., Xeroform 4 x 4's ABDs and web roll and Ace wrap along with a Polar Care     PLAN OF CARE: Admit to inpatient   PATIENT DISPOSITION:  PACU - hemodynamically stable.

## 2017-04-11 ENCOUNTER — Encounter: Payer: Self-pay | Admitting: Orthopedic Surgery

## 2017-04-11 LAB — BASIC METABOLIC PANEL
ANION GAP: 7 (ref 5–15)
BUN: 18 mg/dL (ref 6–20)
CALCIUM: 7.8 mg/dL — AB (ref 8.9–10.3)
CO2: 25 mmol/L (ref 22–32)
Chloride: 102 mmol/L (ref 101–111)
Creatinine, Ser: 0.97 mg/dL (ref 0.61–1.24)
Glucose, Bld: 169 mg/dL — ABNORMAL HIGH (ref 65–99)
Potassium: 4 mmol/L (ref 3.5–5.1)
SODIUM: 134 mmol/L — AB (ref 135–145)

## 2017-04-11 LAB — CBC
HEMATOCRIT: 42.8 % (ref 40.0–52.0)
Hemoglobin: 14.3 g/dL (ref 13.0–18.0)
MCH: 30.9 pg (ref 26.0–34.0)
MCHC: 33.4 g/dL (ref 32.0–36.0)
MCV: 92.7 fL (ref 80.0–100.0)
Platelets: 235 10*3/uL (ref 150–440)
RBC: 4.62 MIL/uL (ref 4.40–5.90)
RDW: 13.9 % (ref 11.5–14.5)
WBC: 10.9 10*3/uL — AB (ref 3.8–10.6)

## 2017-04-11 NOTE — Progress Notes (Signed)
PT Cancellation Note  Patient Details Name: Keith King MRN: [id number] DOB: 1950/06/24   Cancelled Treatment:    Reason Eval/Treat Not Completed: Medical issues which prohibited therapy.  Pt reports pain in his L calf which is not his baseline.  +Homan's sign and pain with squeeze to L calf.  RN notified of concern for possible DVT.  Will hold PT.    Encarnacion ChuAshley Skylynne Schlechter PT, DPT 04/11/2017, 3:54 PM

## 2017-04-11 NOTE — NC FL2 (Signed)
[place] MEDICAID FL2 LEVEL OF CARE SCREENING TOOL     IDENTIFICATION  Patient Name: Keith King Birthdate: Jun 05, 1950 Sex: male Admission Date (Current Location): 04/10/2017  [place] and [place] Number:  Chiropodist and Address:  [facility], [address] [place], [place] [address]      Provider Number: [id number]  Attending Physician Name and Address:  Kennedy Bucker, MD  Relative Name and Phone Number:       Current Level of Care: Hospital Recommended Level of Care: Skilled Nursing Facility Prior Approval Number:    Date Approved/Denied:   PASRR Number:   [id number]  Discharge Plan: SNF    Current Diagnoses: Patient Active Problem List   Diagnosis Date Noted  . Primary osteoarthritis of left knee 04/10/2017    Orientation RESPIRATION BLADDER Height & Weight     Self, Time, Situation, Place  Normal Continent Weight: 232 lb (105.2 kg) Height:  6\' 1"  (185.4 cm)  BEHAVIORAL SYMPTOMS/MOOD NEUROLOGICAL BOWEL NUTRITION STATUS      Continent Diet(Diet: Carb Modified. )  AMBULATORY STATUS COMMUNICATION OF NEEDS Skin   Extensive Assist Verbally Surgical wounds(Incision: Left Knee. )                       Personal Care Assistance Level of Assistance  Bathing, Feeding, Dressing Bathing Assistance: Limited assistance Feeding assistance: Independent Dressing Assistance: Limited assistance     Functional Limitations Info  Sight, Hearing, Speech Sight Info: Adequate Hearing Info: Adequate Speech Info: Adequate    SPECIAL CARE FACTORS FREQUENCY  PT (By licensed PT), OT (By licensed OT)     PT Frequency: (5) OT Frequency: (5)            Contractures      Additional Factors Info  Code Status, Allergies Code Status Info: (Full Code. ) Allergies Info: (Oxycodone. )           Current Medications (04/11/2017):  This is the current hospital active medication list Current Facility-Administered Medications   Medication Dose Route Frequency Provider Last Rate Last Dose  . 0.9 %  sodium chloride infusion   Intravenous Continuous Kennedy Bucker, MD 75 mL/hr at 04/11/17 0526    . acetaminophen (TYLENOL) tablet 650 mg  650 mg Oral Q4H PRN Kennedy Bucker, MD       Or  . acetaminophen (TYLENOL) suppository 650 mg  650 mg Rectal Q4H PRN Kennedy Bucker, MD      . albuterol (PROVENTIL) (2.5 MG/3ML) 0.083% nebulizer solution 2.5 mg  2.5 mg Nebulization Q4H PRN Kennedy Bucker, MD      . alum & mag hydroxide-simeth (MAALOX/MYLANTA) 200-200-20 MG/5ML suspension 30 mL  30 mL Oral Q4H PRN Kennedy Bucker, MD      . amLODipine (NORVASC) tablet 5 mg  5 mg Oral QAC breakfast Kennedy Bucker, MD   5 mg at 04/11/17 0847  . aspirin EC tablet 81 mg  81 mg Oral Daily Kennedy Bucker, MD   81 mg at 04/11/17 0857  . atorvastatin (LIPITOR) tablet 80 mg  80 mg Oral q1800 Kennedy Bucker, MD   80 mg at 04/10/17 1723  . bisacodyl (DULCOLAX) suppository 10 mg  10 mg Rectal Daily PRN Kennedy Bucker, MD      . cyclobenzaprine (FLEXERIL) tablet 10 mg  10 mg Oral Daily Kennedy Bucker, MD   10 mg at 04/11/17 0857  . diphenhydrAMINE (BENADRYL) 12.5 MG/5ML elixir 12.5-25 mg  12.5-25 mg Oral Q4H PRN Kennedy Bucker,  MD      . docusate sodium (COLACE) capsule 100 mg  100 mg Oral BID Kennedy BuckerMenz, Michael, MD   100 mg at 04/11/17 0858  . enoxaparin (LOVENOX) injection 30 mg  30 mg Subcutaneous Q12H Kennedy BuckerMenz, Michael, MD   30 mg at 04/11/17 0847  . fluticasone (FLONASE) 50 MCG/ACT nasal spray 2 spray  2 spray Each Nare Daily PRN Kennedy BuckerMenz, Michael, MD      . glipiZIDE (GLUCOTROL XL) 24 hr tablet 10 mg  10 mg Oral Q breakfast Kennedy BuckerMenz, Michael, MD   10 mg at 04/11/17 0859  . glipiZIDE (GLUCOTROL XL) 24 hr tablet 5 mg  5 mg Oral Q supper Kennedy BuckerMenz, Michael, MD   5 mg at 04/10/17 1722  . HYDROcodone-acetaminophen (NORCO) 10-325 MG per tablet 2 tablet  2 tablet Oral Q4H PRN Kennedy BuckerMenz, Michael, MD      . HYDROcodone-acetaminophen (NORCO/VICODIN) 5-325 MG per tablet 1 tablet  1 tablet Oral  Q4H PRN Kennedy BuckerMenz, Michael, MD   1 tablet at 04/11/17 0932  . lisinopril (PRINIVIL,ZESTRIL) tablet 40 mg  40 mg Oral Q breakfast Kennedy BuckerMenz, Michael, MD   40 mg at 04/11/17 0847  . loratadine (CLARITIN) tablet 10 mg  10 mg Oral Daily Kennedy BuckerMenz, Michael, MD   10 mg at 04/11/17 0857  . magnesium citrate solution 1 Bottle  1 Bottle Oral Once PRN Kennedy BuckerMenz, Michael, MD      . magnesium hydroxide (MILK OF MAGNESIA) suspension 30 mL  30 mL Oral Daily PRN Kennedy BuckerMenz, Michael, MD   30 mL at 04/11/17 0856  . menthol-cetylpyridinium (CEPACOL) lozenge 3 mg  1 lozenge Oral PRN Kennedy BuckerMenz, Michael, MD       Or  . phenol (CHLORASEPTIC) mouth spray 1 spray  1 spray Mouth/Throat PRN Kennedy BuckerMenz, Michael, MD      . metFORMIN (GLUCOPHAGE) tablet 1,000 mg  1,000 mg Oral BID AC Kennedy BuckerMenz, Michael, MD   1,000 mg at 04/11/17 0858  . methocarbamol (ROBAXIN) tablet 500 mg  500 mg Oral Q6H PRN Kennedy BuckerMenz, Michael, MD       Or  . methocarbamol (ROBAXIN) 500 mg in dextrose 5 % 50 mL IVPB  500 mg Intravenous Q6H PRN Kennedy BuckerMenz, Michael, MD      . metoCLOPramide (REGLAN) tablet 5-10 mg  5-10 mg Oral Q8H PRN Kennedy BuckerMenz, Michael, MD       Or  . metoCLOPramide (REGLAN) injection 5-10 mg  5-10 mg Intravenous Q8H PRN Kennedy BuckerMenz, Michael, MD      . metoprolol succinate (TOPROL-XL) 24 hr tablet 12.5 mg  12.5 mg Oral QAC breakfast Kennedy BuckerMenz, Michael, MD   12.5 mg at 04/11/17 0847  . morphine 2 MG/ML injection 2 mg  2 mg Intravenous Q1H PRN Kennedy BuckerMenz, Michael, MD      . nitroGLYCERIN (NITROSTAT) SL tablet 0.4 mg  0.4 mg Sublingual Q5 min PRN Kennedy BuckerMenz, Michael, MD      . omega-3 acid ethyl esters (LOVAZA) capsule 1 g  1 g Oral QAC breakfast Kennedy BuckerMenz, Michael, MD   1 g at 04/11/17 0847  . ondansetron (ZOFRAN) tablet 4 mg  4 mg Oral Q6H PRN Kennedy BuckerMenz, Michael, MD   4 mg at 04/11/17 1214   Or  . ondansetron [facility](ZOFRAN) injection 4 mg  4 mg Intravenous Q6H PRN Kennedy BuckerMenz, Michael, MD   4 mg at 04/10/17 1839  . pantoprazole (PROTONIX) EC tablet 40 mg  40 mg Oral QAC breakfast Kennedy BuckerMenz, Michael, MD   40 mg at 04/11/17 0847  . vitamin C  (ASCORBIC ACID) tablet  500 mg  500 mg Oral Daily Kennedy BuckerMenz, Michael, MD   500 mg at 04/11/17 [id number]  . zolpidem (AMBIEN) tablet 5 mg  5 mg Oral QHS PRN Kennedy BuckerMenz, Michael, MD   5 mg at 04/10/17 2245     Discharge Medications: Please see discharge summary for a list of discharge medications.  Relevant Imaging Results:  Relevant Lab Results:   Additional Information  SSN: [id number]  Fronie Holstein, Darleen CrockerBailey M, LCSW

## 2017-04-11 NOTE — Addendum Note (Signed)
Addendum  created 04/11/17 1020 by Yves Dillarroll, Sherra Kimmons, MD   Intraprocedure Blocks edited, Intraprocedure Event edited, Sign clinical note

## 2017-04-11 NOTE — Progress Notes (Signed)
   Subjective: 1 Day Post-Op Procedure(s) (LRB): TOTAL KNEE ARTHROPLASTY (Left) Patient reports pain as 3 on 0-10 scale.   Patient is well, and has had no acute complaints or problems Denies any CP, SOB, ABD pain. We will start therapy today.    Objective: Vital signs in last 24 hours: Temp:  [97 F (36.1 C)-99.2 F (37.3 C)] 98.2 F (36.8 C) (12/05 0804) Pulse Rate:  [70-96] 84 (12/05 0804) Resp:  [13-20] 18 (12/05 0804) BP: (92-148)/(57-92) 128/67 (12/05 0804) SpO2:  [90 %-99 %] 93 % (12/05 0804) Weight:  [105.2 kg (232 lb)] 105.2 kg (232 lb) (12/04 1522)  Intake/Output from previous day: 12/04 0701 - 12/05 0700 In: 3663.8 [P.O.:960; I.V.:2603.8; IV Piggyback:100] Out: 1300 [Urine:1200; Blood:100] Intake/Output this shift: No intake/output data recorded.  Recent Labs    04/10/17 1514 04/11/17 0558  HGB 15.2 14.3   Recent Labs    04/10/17 1514 04/11/17 0558  WBC 11.0* 10.9*  RBC 4.95 4.62  HCT 45.7 42.8  PLT 235 235   Recent Labs    04/10/17 1514 04/11/17 0558  NA  --  134*  K  --  4.0  CL  --  102  CO2  --  25  BUN  --  18  CREATININE 0.90 0.97  GLUCOSE  --  169*  CALCIUM  --  7.8*   No results for input(s): LABPT, INR in the last 72 hours.  EXAM General - Patient is Alert, Appropriate and Oriented Extremity - Neurovascular intact Sensation intact distally Intact pulses distally Dorsiflexion/Plantar flexion intact No cellulitis present Compartment soft Dressing - dressing C/D/I and no drainage Motor Function - intact, moving foot and toes well on exam.   Past Medical History:  Diagnosis Date  . Arthritis   . Asthma    cold weather and mold trigger attacks  . Complication of anesthesia    gas makes me sick  . Coronary artery disease   . Diabetes mellitus without complication (HCC)   . GERD (gastroesophageal reflux disease)   . History of kidney stones   . Hypercholesterolemia   . Hypertension   . PONV (postoperative nausea and  vomiting)    gas makes me sick  . Sleep apnea    uses CPAP    Assessment/Plan:   1 Day Post-Op Procedure(s) (LRB): TOTAL KNEE ARTHROPLASTY (Left) Active Problems:   Primary osteoarthritis of left knee  Estimated body mass index is 30.61 kg/m as calculated from the following:   Height as of this encounter: 6\' 1"  (1.854 m).   Weight as of this encounter: 105.2 kg (232 lb). Advance diet Up with therapy  Needs BM VS and Labs are stable CM to assist with discharge   DVT Prophylaxis - Lovenox, Foot Pumps and TED hose Weight-Bearing as tolerated to left leg   T. Cranston Neighborhris Harrietta Incorvaia, PA-C [facility] 04/11/2017, 8:27 AM

## 2017-04-11 NOTE — Progress Notes (Signed)
Bone foam removed after multiple attempts of pt. Trying to tolerate it. Pt. Refuses for bone foam to stay on.

## 2017-04-11 NOTE — Progress Notes (Signed)
Occupational Therapy Treatment Patient Details Name: Keith King MRN: [id number] DOB: 22-May-1950 Today's Date: 04/11/2017    History of present illness Pt. is a 66 y.o. male who was admitted to [facility] for a Left TKR. Pt. PMHx includes: Arthritis, Asthma, CAD, DM, GERD, Kidney Stones, Hypercholesterolemia, HTN, PONV, Sleep Apnea.   OT comments  Pt. Presents with weakness, 4/10 pain, limited ROM, and limited mobility which limit his ability to complete ADL, and IADL tasks. Pt. Resides at home with his wife, and was independent with ADLs, IADLs, driving, and working part-time as a Music therapistcarpenter. Pt. Education was provided about A/E use for LE ADLs. Pt. Reports being familiar with the recovery process as his wife has had a knee replacement. Pt. Wife plans to assist pt. As needed with ADLs, IADLs, and meals. Pt. Could benefit from skilled OT services for ADL training, A/E training, and pt. Education about home modification, and DME. Pt. Plans to return home with wife assist upon discharge.   Follow Up Recommendations  No OT follow up    Equipment Recommendations       Recommendations for Other Services  PT    Precautions / Restrictions Precautions Required Braces or Orthoses: Knee Immobilizer - Left Restrictions Weight Bearing Restrictions: Yes LLE Weight Bearing: Weight bearing as tolerated                                                     ADL either performed or assessed with clinical judgement   ADL Overall ADL's : Needs assistance/impaired Eating/Feeding: Set up;Independent   Grooming: Set up;Independent   Upper Body Bathing: Independent   Lower Body Bathing: Moderate assistance   Upper Body Dressing : Set up;Independent   Lower Body Dressing: Maximal assistance    Functional Mobility: Deferred             General ADL Comments: Pt. education was provided about A/E use for LE ADLs.     Vision Baseline Vision/History: Wears glasses Wears  Glasses: At all times Patient Visual Report: No change from baseline     Perception     Praxis      Cognition Arousal/Alertness: Awake/alert Behavior During Therapy: WFL for tasks assessed/performed Overall Cognitive Status: Within Functional Limits for tasks assessed                                          Exercises     Shoulder Instructions       General Comments      Pertinent Vitals/ Pain       Pain Assessment: 0-10 Pain Score: 4  Pain Descriptors / Indicators: Aching;Shooting Pain Intervention(s): Limited activity within patient's tolerance;Monitored during session  Home Living Family/patient expects to be discharged to:: Private residence Living Arrangements: Spouse/significant other Available Help at Discharge: Family Type of Home: House Home Access: Stairs to enter Secretary/administratorntrance Stairs-Number of Steps: 3 Entrance Stairs-Rails: None Home Layout: One level     Bathroom Shower/Tub: Tub/shower unit;Walk-in shower   Bathroom Toilet: Standard     Home Equipment: Hand held shower head          Prior Functioning/Environment Level of Independence: Independent        Comments: Pt. was independent with ADLs, IADLs, driving, and  working part-time in Event organisercarpentry.   Frequency  Min 2X/week        Progress Toward Goals  OT Goals(current goals can now be found in the care plan section)     Acute Rehab OT Goals Patient Stated Goal: To return home OT Goal Formulation: With patient Potential to Achieve Goals: Good  Plan      Co-evaluation                 AM-PAC PT "6 Clicks" Daily Activity     Outcome Measure   Help from another person eating meals?: None Help from another person taking care of personal grooming?: A Little Help from another person toileting, which includes using toliet, bedpan, or urinal?: A Little Help from another person bathing (including washing, rinsing, drying)?: A Lot Help from another person to put on  and taking off regular upper body clothing?: None Help from another person to put on and taking off regular lower body clothing?: A Lot 6 Click Score: 18    End of Session    OT Visit Diagnosis: Muscle weakness (generalized) (M62.81)   Activity Tolerance Patient tolerated treatment well   Patient Left in bed   Nurse Communication      Functional Assessment Tool Used: AM-PAC 6 Clicks Daily Activity;Clinical judgement Self Care Current Status (Z6109(G8987): At least 20 percent but less than 40 percent impaired, limited or restricted Self Care Goal Status (U0454(G8988): 0 percent impaired, limited or restricted   Time: [id number] OT Time Calculation (min): 23 min  Charges: OT G-codes **NOT FOR INPATIENT CLASS** Functional Assessment Tool Used: AM-PAC 6 Clicks Daily Activity;Clinical judgement Self Care Current Status (N8295(G8987): At least 20 percent but less than 40 percent impaired, limited or restricted Self Care Goal Status (A2130(G8988): 0 percent impaired, limited or restricted OT General Charges $OT Visit: 1 Visit OT Evaluation $OT Eval Moderate Complexity: 1 Mod  Olegario MessierElaine Adaliah Hiegel, MS, OTR/L    Olegario MessierElaine Hebe Merriwether, MS, OTR/L 04/11/2017, 9:52 AM

## 2017-04-11 NOTE — Evaluation (Signed)
Physical Therapy Evaluation Patient Details Name: Keith JanusGary [place]le MRN: [id number] DOB: 1950-10-01 Today's Date: 04/11/2017   History of Present Illness  Pt is a 66 y/o M s/p L TKA.   Clinical Impression  Pt is s/p L TKA resulting in the deficits listed below (see PT Problem List). L KI was used a pt unable to perform SLR without assist.  He ambulated 30 ft with RW before onset of nausea which improved but did not resolve, RN notified. Given pt's current mobility status, recommending SNF at d/c but will update recommendations should the pt progress well with future sessions.  Pt will benefit from skilled PT to increase their independence and safety with mobility to allow discharge to the venue listed below.     Follow Up Recommendations SNF    Equipment Recommendations  Rolling walker with 5" wheels    Recommendations for Other Services       Precautions / Restrictions Precautions Precautions: Fall;Knee Precaution Booklet Issued: No Precaution Comments: Instructed pt in no pillow under knee Required Braces or Orthoses: Knee Immobilizer - Left Knee Immobilizer - Left: Other (comment)(No orders, pt with barce on upon arrival) Restrictions Weight Bearing Restrictions: Yes LLE Weight Bearing: Weight bearing as tolerated      Mobility  Bed Mobility Overal bed mobility: Needs Assistance Bed Mobility: Supine to Sit     Supine to sit: Min guard;HOB elevated     General bed mobility comments: Increased time and effort with heavy use of bed rail.  Cues for sequencing.   Transfers Overall transfer level: Needs assistance Equipment used: Rolling walker (2 wheeled) Transfers: Sit to/from Stand Sit to Stand: Min guard;From elevated surface         General transfer comment: Cues for proper hand placement and technique.  Assist to boost to standing.  Pt with well controlled descent to sit.   Ambulation/Gait Ambulation/Gait assistance: Min guard Ambulation Distance (Feet): 30  Feet Assistive device: Rolling walker (2 wheeled) Gait Pattern/deviations: Step-to pattern;Decreased stride length;Decreased stance time - left;Decreased step length - right;Decreased weight shift to left;Antalgic Gait velocity: decreased Gait velocity interpretation: Below normal speed for age/gender General Gait Details: Pt with slow but steady gait.  Cues for sequencing using RW.  After ambulating 30 ft pt reports dizziness and nausea.  Dizziness improves upon sitting but nausea persists, RN notified.   Stairs            Wheelchair Mobility    Modified Rankin (Stroke Patients Only)       Balance Overall balance assessment: Needs assistance Sitting-balance support: No upper extremity supported;Feet supported Sitting balance-Leahy Scale: Good     Standing balance support: Single extremity supported;During functional activity Standing balance-Leahy Scale: Poor Standing balance comment: Pt relies on UE spport for static and dynamic activities                             Pertinent Vitals/Pain Pain Assessment: 0-10 Pain Score: 8  Pain Location: L knee Pain Descriptors / Indicators: Aching;Operative site guarding Pain Intervention(s): Limited activity within patient's tolerance;Monitored during session;Repositioned;Premedicated before session;Utilized relaxation techniques;Ice applied    Home Living Family/patient expects to be discharged to:: Private residence Living Arrangements: Spouse/significant other Available Help at Discharge: Family;Available 24 hours/day Type of Home: House Home Access: Stairs to enter Entrance Stairs-Rails: None Entrance Stairs-Number of Steps: 3 Home Layout: One level Home Equipment: Hand held shower head      Prior Function Level of  Independence: Independent         Comments: Pt. was independent with ADLs, IADLs, driving, and working part-time in Event organiser.     Hand Dominance   Dominant Hand: Right     Extremity/Trunk Assessment   Upper Extremity Assessment Upper Extremity Assessment: Overall WFL for tasks assessed    Lower Extremity Assessment Lower Extremity Assessment: LLE deficits/detail LLE Deficits / Details: Unable to formally assess strength but grossly functionally 3-/5       Communication   Communication: No difficulties  Cognition Arousal/Alertness: Awake/alert Behavior During Therapy: WFL for tasks assessed/performed Overall Cognitive Status: Within Functional Limits for tasks assessed                                        General Comments General comments (skin integrity, edema, etc.): Wife, Darl Pikes, present during Evaluation.     Exercises Total Joint Exercises Ankle Circles/Pumps: AROM;Both;10 reps;Supine Quad Sets: Strengthening;Both;10 reps;Supine Straight Leg Raises: AAROM;Strengthening;Left;10 reps;Supine Knee Flexion: AAROM;Left;5 reps;Seated;Other (comment)(with 5 second holds) Goniometric ROM: 11 to 82 deg    Assessment/Plan    PT Assessment Patient needs continued PT services  PT Problem List Decreased strength;Decreased range of motion;Decreased activity tolerance;Decreased balance;Decreased mobility;Decreased knowledge of use of DME;Decreased safety awareness;Pain;Decreased knowledge of precautions       PT Treatment Interventions DME instruction;Gait training;Functional mobility training;Stair training;Therapeutic activities;Therapeutic exercise;Balance training;Neuromuscular re-education;Patient/family education;Modalities;Wheelchair mobility training    PT Goals (Current goals can be found in the Care Plan section)  Acute Rehab PT Goals Patient Stated Goal: to go home PT Goal Formulation: With patient Time For Goal Achievement: 04/25/17 Potential to Achieve Goals: Good    Frequency BID   Barriers to discharge Inaccessible home environment Steps to enter home    Co-evaluation               AM-PAC PT "6 Clicks"  Daily Activity  Outcome Measure Difficulty turning over in bed (including adjusting bedclothes, sheets and blankets)?: Unable Difficulty moving from lying on back to sitting on the side of the bed? : Unable Difficulty sitting down on and standing up from a chair with arms (e.g., wheelchair, bedside commode, etc,.)?: A Lot Help needed moving to and from a bed to chair (including a wheelchair)?: A Little Help needed walking in hospital room?: A Little Help needed climbing 3-5 steps with a railing? : A Little 6 Click Score: 13    End of Session Equipment Utilized During Treatment: Gait belt;Left knee immobilizer Activity Tolerance: Other (comment)(limited by nausea) Patient left: in chair;with call bell/phone within reach;with chair alarm set;with family/visitor present;Other (comment)(with towel roll and polar care in place) Nurse Communication: Mobility status;Other (comment)(pt's nausea) PT Visit Diagnosis: Pain;Unsteadiness on feet (R26.81);Muscle weakness (generalized) (M62.81);Other abnormalities of gait and mobility (R26.89) Pain - Right/Left: Left Pain - part of body: Knee    Time: 1610-9604 PT Time Calculation (min) (ACUTE ONLY): 40 min   Charges:   PT Evaluation $PT Eval Low Complexity: 1 Low PT Treatments $Gait Training: 8-22 mins $Therapeutic Exercise: 8-22 mins   PT G Codes:   PT G-Codes **NOT FOR INPATIENT CLASS** Functional Assessment Tool Used: AM-PAC 6 Clicks Basic Mobility;Clinical judgement Functional Limitation: Mobility: Walking and moving around Mobility: Walking and Moving Around Current Status (V4098): At least 40 percent but less than 60 percent impaired, limited or restricted Mobility: Walking and Moving Around Goal Status [phone]): At least 1 percent but less  than 20 percent impaired, limited or restricted    Encarnacion ChuAshley Stephinie Battisti PT, DPT 04/11/2017, 1:31 PM

## 2017-04-11 NOTE — Care Management Note (Signed)
Case Management Note  Patient Details  Name: Keith King MRN: [id number] Date of Birth: 1951/02/11  Subjective/Objective:  Met with patient to discuss discharge planning. PT recommending SNF and patient has refused. Offered choice of home health agencies. Referral to [facility] for HHPT. Ordered a walker and bsc from [facility]. Pharmacy: [place] [phone]. Called Lovenox 40 mg # 14 no refills.                    Action/Plan: [facility] for HHPT. [facility] for walker and bsc. Lovenox called in for price. It is anticipated that patient will discharge tomorrow  Expected Discharge Date:                  Expected Discharge Plan:  [place]  In-House Referral:     Discharge planning Services  CM Consult  Post Acute Care Choice:  [place], Durable Medical Equipment Choice offered to:  Patient  DME Arranged:  Bedside commode, Walker rolling DME Agency:  [place]:  PT [facility] Agency:  [facility]  Status of Service:  In process, will continue to follow  If discussed at Long Length of Stay Meetings, dates discussed:    Additional Comments:  Jolly Mango, RN 04/11/2017, 2:32 PM

## 2017-04-11 NOTE — Anesthesia Postprocedure Evaluation (Signed)
Anesthesia Post Note  Patient: Keith King  Procedure(s) Performed: TOTAL KNEE ARTHROPLASTY (Left Knee)  Patient location during evaluation: Nursing Unit Anesthesia Type: Spinal Level of consciousness: oriented and awake and alert Pain management: satisfactory to patient Vital Signs Assessment: post-procedure vital signs reviewed and stable Respiratory status: spontaneous breathing and respiratory function stable Cardiovascular status: blood pressure returned to baseline and stable Postop Assessment: no headache, no backache, no apparent nausea or vomiting and spinal receding Anesthetic complications: no     Last Vitals:  Vitals:   04/10/17 2355 04/11/17 0439  BP: 135/68 (!) 141/68  Pulse: 92 96  Resp:    Temp: 36.9 C 36.9 C  SpO2: 90% 91%    Last Pain:  Vitals:   04/11/17 0627  TempSrc:   PainSc: Asleep                 Jules SchickLogan,  Airel Magadan P

## 2017-04-11 NOTE — Clinical Social Work Note (Signed)
Clinical Social Work Assessment  Patient Details  Name: Keith King MRN: [id number] Date of Birth: 09/21/1950  Date of referral:  04/11/17               Reason for consult:  Facility Placement                Permission sought to share information with:    Permission granted to share information::     Name::        Agency::     Relationship::     Contact Information:     Housing/Transportation Living arrangements for the past 2 months:  Single Family Home Source of Information:  Patient Patient Interpreter Needed:  None Criminal Activity/Legal Involvement Pertinent to Current Situation/Hospitalization:  No - Comment as needed Significant Relationships:  Friend, Spouse Lives with:  Spouse Do you feel safe going back to the place where you live?  Yes Need for family participation in patient care:  Yes (Comment)  Care giving concerns:  Patient lives in [place], [place] with his wife [name].    Social Worker assessment / plan:  Clinical Social Worker (CSW) received SNF consult. PT is recommending SNF. CSW met with patient and his friend Jim was at bedside. CSW introduced self and explained role of CSW department. Patient reported that he lives in [place] with his wife [name]. Per patient he has a cpap from home in his room at [facility]. CSW explained SNF process and that [employer] will have to approve SNF. Patient refused SNF and stated that he is going home with home health. Per patient his wife can provide 24/7 support. Patient reported that he does not need SNF and will go home with home health. RN case manager aware of above. CSW will continue to follow and assist as needed.   Employment status:  Retired Insurance information:  Managed Medicare PT Recommendations:  Skilled Nursing Facility Information / Referral to community resources:  Other (Comment Required)(Patient refused SNF. )  Patient/Family's Response to care:  Patient refused SNF.   Patient/Family's  Understanding of and Emotional Response to Diagnosis, Current Treatment, and Prognosis:  Patient was very pleasant and thanked CSW for assistance.   Emotional Assessment Appearance:  Appears stated age Attitude/Demeanor/Rapport:    Affect (typically observed):  Pleasant Orientation:  Oriented to Self, Oriented to Place, Oriented to  Time, Oriented to Situation Alcohol / Substance use:  Not Applicable Psych involvement (Current and /or in the community):  No (Comment)  Discharge Needs  Concerns to be addressed:  Discharge Planning Concerns Readmission within the last 30 days:  No Current discharge risk:  Dependent with Mobility Barriers to Discharge:  Continued Medical Work up   Sample, Keith M, LCSW 04/11/2017, 1:07 PM  

## 2017-04-12 LAB — GLUCOSE, CAPILLARY
GLUCOSE-CAPILLARY: 197 mg/dL — AB (ref 65–99)
GLUCOSE-CAPILLARY: 208 mg/dL — AB (ref 65–99)
GLUCOSE-CAPILLARY: 203 mg/dL — AB (ref 65–99)
GLUCOSE-CAPILLARY: 164 mg/dL — AB (ref 65–99)

## 2017-04-12 MED ORDER — LACTULOSE 10 GM/15ML PO SOLN
20.0000 g | Freq: Once | ORAL | Status: AC
  Administered 2017-04-12: 20 g via ORAL
  Filled 2017-04-12: qty 30

## 2017-04-12 MED ORDER — INSULIN ASPART 100 UNIT/ML ~~LOC~~ SOLN
0.0000 [IU] | Freq: Three times a day (TID) | SUBCUTANEOUS | Status: DC
  Administered 2017-04-12: 5 [IU] via SUBCUTANEOUS
  Administered 2017-04-13 (×2): 3 [IU] via SUBCUTANEOUS
  Filled 2017-04-12 (×3): qty 1

## 2017-04-12 MED ORDER — ENOXAPARIN SODIUM 40 MG/0.4ML ~~LOC~~ SOLN: 40.0000 mg | SUBCUTANEOUS | 0 refills | Status: AC

## 2017-04-12 MED ORDER — HYDROCODONE-ACETAMINOPHEN 5-325 MG PO TABS: 1.0000 | ORAL_TABLET | ORAL | 0 refills | Status: AC | PRN

## 2017-04-12 NOTE — Progress Notes (Signed)
Physical Therapy Treatment Patient Details Name: Keith King  Todorov MRN: [id number] DOB: 1951-02-06 Today's Date: 04/12/2017    History of Present Illness Pt is a 66 y/o M s/p L TKA.     PT Comments    Pt continues to require assist for LLE with bed mobility. He is able to ambulate with slow antalgic gait to RN station and back to recliner. KI donned for all ambulation. Cues for proper seuqencing with walker. Pt also ambulates into bathroom and is able to perform standing balance at toilet to urinate. VSS throughout ambulation with mild increase in L knee pain. He is able to complete all supine exercises as instructed but with increase in knee pain. He continues to have LLE calf swelling, warmth, and pain with squeeze. Spoke with MD this morning who states that it is muscle pain. Will continue to progress ambulation distance with patient and eventually practice stairs. Pt will benefit from PT services to address deficits in strength, balance, and mobility in order to return to full function at home.    Follow Up Recommendations  SNF     Equipment Recommendations  Rolling walker with 5" wheels    Recommendations for Other Services       Precautions / Restrictions Precautions Precautions: Fall;Knee Precaution Booklet Issued: No Precaution Comments: No pillow under operative extremity Required Braces or Orthoses: Knee Immobilizer - Left Knee Immobilizer - Left: Discontinue once straight leg raise with < 10 degree lag Restrictions Weight Bearing Restrictions: Yes LLE Weight Bearing: Weight bearing as tolerated    Mobility  Bed Mobility Overal bed mobility: Needs Assistance Bed Mobility: Supine to Sit     Supine to sit: Min assist     General bed mobility comments: Pt requires some assist to advance LLE off EOB. HOB elevated and cues for sequencing  Transfers Overall transfer level: Needs assistance Equipment used: Rolling walker (2 wheeled) Transfers: Sit to/from  Stand Sit to Stand: Min guard         General transfer comment: Pt with heavy RLE support during sit to stand transfers. Safe hand placement demonstrated today. Multiple attempts and increased time required to come to standing  Ambulation/Gait Ambulation/Gait assistance: Min guard Ambulation Distance (Feet): 80 Feet Assistive device: Rolling walker (2 wheeled) Gait Pattern/deviations: Step-to pattern;Decreased weight shift to left;Antalgic;Decreased step length - right;Decreased stance time - left Gait velocity: Decreased Gait velocity interpretation: Below normal speed for age/gender General Gait Details: Pt able to ambulate with slow antalgic gait to RN station and back to recliner. KI donned for all ambulation. Cues for proper seuqencing with walker. Pt also ambulates into bathroom and is able to perform standing balance at toilet to urinate. VSS throughout ambulation with mild increase in L knee pain   Stairs            Wheelchair Mobility    Modified Rankin (Stroke Patients Only)       Balance Overall balance assessment: Needs assistance Sitting-balance support: No upper extremity supported Sitting balance-Leahy Scale: Good     Standing balance support: No upper extremity supported Standing balance-Leahy Scale: Fair Standing balance comment: Able to balance at commode without UE support to urinate                            Cognition Arousal/Alertness: Awake/alert Behavior During Therapy: WFL for tasks assessed/performed Overall Cognitive Status: Within Functional Limits for tasks assessed  Exercises Total Joint Exercises Ankle Circles/Pumps: AROM;Both;10 reps;Supine Quad Sets: Strengthening;Both;10 reps;Supine Gluteal Sets: Strengthening;Both;10 reps;Supine Towel Squeeze: Strengthening;Both;10 reps;Supine Short Arc Quad: Strengthening;Left;10 reps;Supine Heel Slides: Strengthening;Left;10  reps;Supine Hip ABduction/ADduction: Strengthening;Left;10 reps;Supine Straight Leg Raises: Strengthening;Left;10 reps;Supine Goniometric ROM: -8 to 73 degrees AAROM, pain limited    General Comments        Pertinent Vitals/Pain Pain Assessment: No/denies pain Pain Location: Denies L knee pain upon arrival. Recently given pain medication Pain Descriptors / Indicators: Aching;Operative site guarding Pain Intervention(s): Monitored during session;Premedicated before session    Home Living                      Prior Function            PT Goals (current goals can now be found in the care plan section) Acute Rehab PT Goals Patient Stated Goal: to go home PT Goal Formulation: With patient Time For Goal Achievement: 04/25/17 Potential to Achieve Goals: Good Progress towards PT goals: Progressing toward goals    Frequency    BID      PT Plan Current plan remains appropriate    Co-evaluation              AM-PAC PT "6 Clicks" Daily Activity  Outcome Measure  Difficulty turning over in bed (including adjusting bedclothes, sheets and blankets)?: Unable Difficulty moving from lying on back to sitting on the side of the bed? : Unable Difficulty sitting down on and standing up from a chair with arms (e.g., wheelchair, bedside commode, etc,.)?: A Little Help needed moving to and from a bed to chair (including a wheelchair)?: A Little Help needed walking in hospital room?: A Little Help needed climbing 3-5 steps with a railing? : A Little 6 Click Score: 14    End of Session Equipment Utilized During Treatment: Gait belt;Left knee immobilizer   Patient left: in chair;with call bell/phone within reach;with chair alarm set;with family/visitor present;with SCD's reapplied;Other (comment)(towel roll under heel and polar care in place)   PT Visit Diagnosis: Pain;Unsteadiness on feet (R26.81);Muscle weakness (generalized) (M62.81);Other abnormalities of gait and  mobility (R26.89) Pain - Right/Left: Left Pain - part of body: Knee     Time: [id number] PT Time Calculation (min) (ACUTE ONLY): 40 min  Charges:  $Gait Training: 8-22 mins $Therapeutic Exercise: 8-22 mins $Therapeutic Activity: 8-22 mins                    G Codes:     Sharalyn InkJason D Tashonna Descoteaux PT, DPT     Aws Shere 04/12/2017, 11:58 AM

## 2017-04-12 NOTE — Discharge Summary (Signed)
Physician Discharge Summary  Patient ID: Keith King MRN: [id number] DOB/AGE: Oct 01, 1950 66 y.o.  Admit date: 04/10/2017 Discharge date: 04/13/2017 Admission Diagnoses:  PRIMARY OSTEOARTHRITIS OF LEFT KNEE   Discharge Diagnoses: Patient Active Problem List   Diagnosis Date Noted  . Primary osteoarthritis of left knee 04/10/2017    Past Medical History:  Diagnosis Date  . Arthritis   . Asthma    cold weather and mold trigger attacks  . Complication of anesthesia    gas makes me sick  . Coronary artery disease   . Diabetes mellitus without complication (HCC)   . GERD (gastroesophageal reflux disease)   . History of kidney stones   . Hypercholesterolemia   . Hypertension   . PONV (postoperative nausea and vomiting)    gas makes me sick  . Sleep apnea    uses CPAP     Transfusion:none   Consultants (if any):   Discharged Condition: Improved  Hospital Course: Keith King is an 66 y.o. male who was admitted 04/10/2017 with a diagnosis of left knee osteoarthritis and went to the operating room on 04/10/2017 and underwent the above named procedures.    Surgeries: Procedure(s): TOTAL KNEE ARTHROPLASTY on 04/10/2017 Patient tolerated the surgery well. Taken to PACU where she was stabilized and then transferred to the orthopedic floor.  Started on Lovenox 30 mg  q 12 hrs. Foot pumps applied bilaterally at 80 mm. Heels elevated on bed with rolled towels. No evidence of DVT. Negative Homan. Physical therapy started on day #1 for gait training and transfer. OT started day #1 for ADL and assisted devices.  Patient's foley was d/c on day #1. Patient's IV was d/c on day #2.  On post op day #3 patient was stable and ready for discharge to home with HHPT.  Implants:Medacta GMK sphere 3+ femur, 3 tibia with short stem 12 mm insert to patella all components cemented    He was given perioperative antibiotics:  Anti-infectives (From admission, onward)   Start      Dose/Rate Route Frequency Ordered Stop   04/10/17 1700  ceFAZolin (ANCEF) IVPB 2g/100 mL premix  Status:  Discontinued     2 g 200 mL/hr over 30 Minutes Intravenous Every 6 hours 04/10/17 1447 04/10/17 1458   04/10/17 1700  ceFAZolin (ANCEF) 2 g in dextrose 5 % 100 mL IVPB     2 g 200 mL/hr over 30 Minutes Intravenous Every 6 hours 04/10/17 1459 04/10/17 2315   04/10/17 0830  ceFAZolin (ANCEF) 2 g in dextrose 5 % 100 mL IVPB  Status:  Discontinued     2 g 240 mL/hr over 30 Minutes Intravenous  Once 04/10/17 0827 04/10/17 1440   04/10/17 0825  ceFAZolin (ANCEF) 2-4 GM/100ML-% IVPB    Comments:  Slemenda, Debra   : cabinet override      04/10/17 0825 04/10/17 2044    .  He was given sequential compression devices, early ambulation, and lovenox for DVT prophylaxis.  He benefited maximally from the hospital stay and there were no complications.    Recent vital signs:  Vitals:   04/12/17 0406 04/12/17 0820  BP: (!) 153/78 (!) 161/74  Pulse: 99 (!) 103  Resp:  18  Temp: 98.6 F (37 C) 97.8 F (36.6 C)  SpO2: 94% 97%    Recent laboratory studies:  Lab Results  Component Value Date   HGB 14.3 04/11/2017   HGB 15.2 04/10/2017   HGB 16.2 03/28/2017   Lab Results  Component  Value Date   WBC 10.9 (H) 04/11/2017   PLT 235 04/11/2017   Lab Results  Component Value Date   INR 0.95 03/28/2017   Lab Results  Component Value Date   NA 134 (L) 04/11/2017   K 4.0 04/11/2017   CL 102 04/11/2017   CO2 25 04/11/2017   BUN 18 04/11/2017   CREATININE 0.97 04/11/2017   GLUCOSE 169 (H) 04/11/2017    Discharge Medications:   Allergies as of 04/12/2017      Reactions   Oxycodone Rash      Medication List    STOP taking these medications   naproxen 500 MG tablet Commonly known as:  NAPROSYN     TAKE these medications   acetaminophen 500 MG tablet Commonly known as:  TYLENOL Take 1,000 mg 2 (two) times daily by mouth.   amLODipine 5 MG tablet Commonly known as:   NORVASC Take 5 mg by mouth every morning.   aspirin EC 81 MG tablet Take 81 mg daily by mouth.   atorvastatin 80 MG tablet Commonly known as:  LIPITOR Take 80 mg by mouth daily at 6 PM.   cyclobenzaprine 5 MG tablet Commonly known as:  FLEXERIL Take 10 mg daily by mouth.   diclofenac sodium 1 % Gel Commonly known as:  VOLTAREN Apply 2 g 4 (four) times daily as needed topically (for pain).   enoxaparin 40 MG/0.4ML injection Commonly known as:  LOVENOX Inject 0.4 mLs (40 mg total) into the skin daily for 14 days.   fexofenadine 180 MG tablet Commonly known as:  ALLEGRA Take 180 mg by mouth daily.   fluticasone 50 MCG/ACT nasal spray Commonly known as:  FLONASE Place 2 sprays daily as needed into both nostrils for allergies or rhinitis.   glipiZIDE 5 MG 24 hr tablet Commonly known as:  GLUCOTROL XL Take 5-10 mg See admin instructions by mouth. Take 10 mg by mouth in the morning and take 5 mg by mouth in the evening   HYDROcodone-acetaminophen 5-325 MG tablet Commonly known as:  NORCO/VICODIN Take 1-2 tablets by mouth every 4 (four) hours as needed for moderate pain ((score 4 to 6)).   lisinopril 40 MG tablet Commonly known as:  PRINIVIL,ZESTRIL Take 40 mg by mouth every morning.   metFORMIN 1000 MG tablet Commonly known as:  GLUCOPHAGE Take 1,000 mg 2 (two) times daily by mouth.   metoprolol succinate 25 MG 24 hr tablet Commonly known as:  TOPROL-XL Take 12.5 mg by mouth every morning.   nitroGLYCERIN 0.4 MG SL tablet Commonly known as:  NITROSTAT Place 0.4 mg every 5 (five) minutes as needed under the tongue for chest pain.   Omega-3 1000 MG Caps Take 1 capsule by mouth every morning.   omeprazole 20 MG capsule Commonly known as:  PRILOSEC Take 20 mg by mouth every morning.   PROVENTIL HFA 108 (90 Base) MCG/ACT inhaler Generic drug:  albuterol Inhale 2 puffs every 4 (four) hours as needed into the lungs for wheezing or shortness of breath.   SUPER B  COMPLEX PO Take 1 tablet 2 (two) times daily by mouth.   testosterone cypionate 100 MG/ML injection Commonly known as:  DEPOTESTOTERONE CYPIONATE Inject 200 mg every 14 (fourteen) days into the muscle. For IM use only   vitamin C 500 MG tablet Commonly known as:  ASCORBIC ACID Take 500 mg daily by mouth.            Durable Medical Equipment  (From admission, onward)  Start     Ordered   04/10/17 1448  DME Walker rolling  Once    Question:  Patient needs a walker to treat with the following condition  Answer:  S/P total knee arthroplasty   04/10/17 1447   04/10/17 1448  DME 3 n 1  Once     04/10/17 1447   04/10/17 1448  DME Bedside commode  Once    Question:  Patient needs a bedside commode to treat with the following condition  Answer:  S/P total knee arthroplasty   04/10/17 1447      Diagnostic Studies: Dg Knee 1-2 Views Left  Result Date: 04/10/2017 CLINICAL DATA:  Total knee replacement. EXAM: LEFT KNEE - 1-2 VIEW COMPARISON:  CT 03/15/2017. FINDINGS: Total right knee replacement. Anatomic alignment. Hardware intact. Prominent calcific densities noted over the popliteal space as previously noted. No acute bony abnormality . IMPRESSION: Total right knee replacement anatomic alignment . Electronically Signed   By: Maisie Fushomas  Register   On: 04/10/2017 13:14   Ct Knee Left Wo Contrast  Result Date: 03/15/2017 CLINICAL DATA:  Primary osteoarthritis of the left knee. Preop for knee surgery. EXAM: CT OF THE LEFT KNEE WITHOUT CONTRAST TECHNIQUE: Multidetector CT imaging of the left knee [facility]. Multiplanar CT image reconstructions were also generated. COMPARISON:  None. FINDINGS: Bones/Joint/Cartilage Left hip: No joint effusion, fracture or suspicious osseous lesions. No periarticular muscle atrophy or soft tissue mass. Left ankle: Periarticular subcutaneous soft tissue edema more so along the medial aspect overlying the medial malleolus.  The tibiotalar and subtalar joints are maintained. No acute nor suspicious osseous abnormalities. Bone island of the cuboid. Degenerative subcortical cystic change along the plantar posterior aspect of the lateral cuneiform. Left knee: Moderate suprapatellar joint effusion with faint suprapatellar densities along the upper margin of the joint capsule that may represent stigmata of a synovitis. Three ossifications are seen within the expected location of a popliteal cyst, range in size from 14 mm in diameter through 18 mm consistent with synovial osteochondromatosis. There is moderate joint space narrowing of the femorotibial compartment with spurring off the femoral condyles and tibial plateau. Subchondral sclerosis from osteoarthritis is noted along the weight-bearing portion of the lateral femoral condyles as well as tibial plateau. Subchondral cystic change are seen deep to the tibial spine and medial tibial plateau posteriorly. Osteoarthritis of the proximal tibiofibular syndesmosis. Mild spurring off the upper and lower pole of the patella. Ligaments Suboptimally assessed by CT. Muscles and Tendons Intact extensor mechanism tendons. No significant muscle atrophy. No intramuscular mass or hemorrhage. Soft tissues No significant soft tissue swelling about the knee. IMPRESSION: 1. Moderate femorotibial and patellofemoral joint osteoarthritis with subchondral sclerosis and spurring about the femorotibial compartment. Subchondral cystic change of the tibial plateau also in keeping with osteoarthritis. 2. Moderate suprapatellar joint effusion with faint soft tissue mineralization that may reflect stigmata of synovitis. 3. Loose bodies noted in the expected location of a popliteal cyst numbering 3 in total and ranging in size from 14 mm to 18 mm. 4. Mild-to-moderate soft tissue swelling about the included left ankle. Electronically Signed   By: Tollie Ethavid  Kwon M.D.   On: 03/15/2017 18:26    Disposition: Final discharge  disposition not confirmed       Signed: Amador CunasGAINES, THOMAS CHRISTOPHER 04/12/2017, 11:31 AM

## 2017-04-12 NOTE — Discharge Instructions (Signed)

## 2017-04-12 NOTE — Progress Notes (Signed)
Physical Therapy Treatment Patient Details Name: Keith King MRN: [id number] DOB: 21-Jul-1950 Today's Date: 04/12/2017    History of Present Illness Pt is a 66 y/o M s/p L TKA.     PT Comments    Focus of session this afternoon is on exercises and stair training. Pt reporting increased levels of pain this afternoon. Premedicated prior to session. He is able to ambulate to bathroom and limited distances in room but recliner used to roll pt to rehab gym. Cues provided for proper sequencing with walker. Cues to increase LLE heel strike. He ascends 4 steps with bilateral UE support on rails and step-to pattern. Cues provided for proper sequencing. Difficulty shifting weight to LLE. After first attempt practiced with patient going backwards using rolling walker. +2 present for additional safety and heavy education about how to perform correctly. Wife is also present for instruction. Pt able to complete but relies heavily with bilateral UE on walker. Pt reports he feels safe performing at home. Stair training is completed and pt now needs to progress his ambulation distance. Hopefully he can complete lap around RN station tomorrow morning. Pt will benefit from PT services to address deficits in strength, balance, and mobility in order to return to full function at home.    Follow Up Recommendations  SNF     Equipment Recommendations  Rolling walker with 5" wheels    Recommendations for Other Services       Precautions / Restrictions Precautions Precautions: Fall;Knee Precaution Booklet Issued: No Precaution Comments: No pillow under operative extremity Required Braces or Orthoses: Knee Immobilizer - Left Knee Immobilizer - Left: Discontinue once straight leg raise with < 10 degree lag Restrictions Weight Bearing Restrictions: Yes LLE Weight Bearing: Weight bearing as tolerated    Mobility  Bed Mobility Overal bed mobility: Needs Assistance Bed Mobility: Supine to Sit      Supine to sit: Min guard     General bed mobility comments: Pt able to move toward the EOB with HOB elevated and use of bed rails. increased time to perform and increased pain while lowering LLE to the floor  Transfers Overall transfer level: Needs assistance Equipment used: Rolling walker (2 wheeled) Transfers: Sit to/from Stand Sit to Stand: Min guard         General transfer comment: Pt with heavy RLE support during sit to stand transfers. Safe hand placement demonstrated today. Performed transfers multiple times with patient.  Ambulation/Gait Ambulation/Gait assistance: Min guard Ambulation Distance (Feet): 25 Feet Assistive device: Rolling walker (2 wheeled) Gait Pattern/deviations: Step-to pattern;Decreased weight shift to left;Antalgic;Decreased step length - right;Decreased stance time - left Gait velocity: Decreased Gait velocity interpretation: Below normal speed for age/gender General Gait Details: Focus of session during PM is stair training. Pt is able to ambulate to bathroom and limited distances in room but recliner used to roll pt to rehab gym. Cues provided for proper sequencing with walker. Cues to increase LLE heel strike   Stairs Stairs: Yes   Stair Management: Two rails;Step to pattern Number of Stairs: 8(4+4) General stair comments: Pt ascends 4 steps with bilateral UE support on rails and step-to pattern. Cues provided for proper sequencing. Difficulty shifting weight to LLE. After first attempt practiced with patient going backwards using rolling walker. +2 present for additional safety and heavy education about how to perform correctly. Wife is also present for instruction. Pt able to complete but relies heavilty with bilateral UE on walker  Wheelchair Mobility    Modified Rankin (  Stroke Patients Only)       Balance Overall balance assessment: Needs assistance Sitting-balance support: No upper extremity supported Sitting balance-Leahy Scale:  Good     Standing balance support: No upper extremity supported Standing balance-Leahy Scale: Fair Standing balance comment: Able to balance at commode without UE support to urinate                            Cognition Arousal/Alertness: Awake/alert Behavior During Therapy: WFL for tasks assessed/performed Overall Cognitive Status: Within Functional Limits for tasks assessed                                        Exercises Total Joint Exercises Ankle Circles/Pumps: AROM;Both;10 reps;Supine Quad Sets: Strengthening;Both;10 reps;Supine Gluteal Sets: Strengthening;Both;10 reps;Supine Towel Squeeze: Strengthening;Both;10 reps;Supine Short Arc Quad: Strengthening;Left;10 reps;Supine Heel Slides: Strengthening;Left;10 reps;Supine Hip ABduction/ADduction: Strengthening;Left;10 reps;Supine Straight Leg Raises: Strengthening;Left;10 reps;Supine    General Comments        Pertinent Vitals/Pain Pain Assessment: 0-10 Pain Score: 8  Pain Location: L posterior knee pain after being in bone foam pillow Pain Descriptors / Indicators: Aching;Operative site guarding    Home Living                      Prior Function            PT Goals (current goals can now be found in the care plan section) Acute Rehab PT Goals Patient Stated Goal: to go home PT Goal Formulation: With patient Time For Goal Achievement: 04/25/17 Potential to Achieve Goals: Good Progress towards PT goals: Progressing toward goals    Frequency    BID      PT Plan Current plan remains appropriate    Co-evaluation              AM-PAC PT "6 Clicks" Daily Activity  Outcome Measure  Difficulty turning over in bed (including adjusting bedclothes, sheets and blankets)?: Unable Difficulty moving from lying on back to sitting on the side of the bed? : Unable Difficulty sitting down on and standing up from a chair with arms (e.g., wheelchair, bedside commode, etc,.)?: A  Little Help needed moving to and from a bed to chair (including a wheelchair)?: A Little Help needed walking in hospital room?: A Little Help needed climbing 3-5 steps with a railing? : A Little 6 Click Score: 14    End of Session Equipment Utilized During Treatment: Gait belt;Left knee immobilizer Activity Tolerance: Patient tolerated treatment well Patient left: in chair;with call bell/phone within reach;with chair alarm set;with family/visitor present;with SCD's reapplied;Other (comment)(towel roll under heel and polar care in place)   PT Visit Diagnosis: Pain;Unsteadiness on feet (R26.81);Muscle weakness (generalized) (M62.81);Other abnormalities of gait and mobility (R26.89) Pain - Right/Left: Left Pain - part of body: Knee     Time: [id number] PT Time Calculation (min) (ACUTE ONLY): 39 min  Charges:  $Therapeutic Exercise: 8-22 mins $Therapeutic Activity: 23-37 mins                    G Codes:       Sharalyn InkJason D Oretta Berkland PT, DPT     Myan Suit 04/12/2017, 3:40 PM

## 2017-04-12 NOTE — Progress Notes (Signed)
   Subjective: 2 Days Post-Op Procedure(s) (LRB): TOTAL KNEE ARTHROPLASTY (Left) Patient reports pain as 7 on 0-10 scale.   Patient is well, and has had no acute complaints or problems. Denies any CP, SOB, ABD pain. We will continue therapy today.    Objective: Vital signs in last 24 hours: Temp:  [98.6 F (37 C)-99 F (37.2 C)] 98.6 F (37 C) (12/06 0406) Pulse Rate:  [99-102] 99 (12/06 0406) Resp:  [16-18] 16 (12/05 2022) BP: (135-153)/(73-82) 153/78 (12/06 0406) SpO2:  [93 %-94 %] 94 % (12/06 0406)  Intake/Output from previous day: 12/05 0701 - 12/06 0700 In: 350 [I.V.:350] Out: 1150 [Urine:1150] Intake/Output this shift: No intake/output data recorded.  Recent Labs    04/10/17 1514 04/11/17 0558  HGB 15.2 14.3   Recent Labs    04/10/17 1514 04/11/17 0558  WBC 11.0* 10.9*  RBC 4.95 4.62  HCT 45.7 42.8  PLT 235 235   Recent Labs    04/10/17 1514 04/11/17 0558  NA  --  134*  K  --  4.0  CL  --  102  CO2  --  25  BUN  --  18  CREATININE 0.90 0.97  GLUCOSE  --  169*  CALCIUM  --  7.8*   No results for input(s): LABPT, INR in the last 72 hours.  EXAM General - Patient is Alert, Appropriate and Oriented Extremity - Neurovascular intact Sensation intact distally Intact pulses distally Dorsiflexion/Plantar flexion intact No cellulitis present Compartment soft  Tender along left lateral calf. No warmth or erythema. Swelling WNL. Dressing - dressing C/D/I and no drainage, new dressing applied Motor Function - intact, moving foot and toes well on exam.   Past Medical History:  Diagnosis Date  . Arthritis   . Asthma    cold weather and mold trigger attacks  . Complication of anesthesia    gas makes me sick  . Coronary artery disease   . Diabetes mellitus without complication (HCC)   . GERD (gastroesophageal reflux disease)   . History of kidney stones   . Hypercholesterolemia   . Hypertension   . PONV (postoperative nausea and vomiting)    gas makes me sick  . Sleep apnea    uses CPAP    Assessment/Plan:   2 Days Post-Op Procedure(s) (LRB): TOTAL KNEE ARTHROPLASTY (Left) Active Problems:   Primary osteoarthritis of left knee  Estimated body mass index is 30.61 kg/m as calculated from the following:   Height as of this encounter: 6\' 1"  (1.854 m).   Weight as of this encounter: 105.2 kg (232 lb). Advance diet Up with therapy  Needs BM VS and Labs are stable CM to assist with discharge   DVT Prophylaxis - Lovenox, Foot Pumps and TED hose Weight-Bearing as tolerated to left leg   T. Cranston Neighborhris Priyal Musquiz, PA-C [facility] 04/12/2017, 8:28 AM

## 2017-04-13 LAB — GLUCOSE, CAPILLARY
GLUCOSE-CAPILLARY: 157 mg/dL — AB (ref 65–99)
GLUCOSE-CAPILLARY: 169 mg/dL — AB (ref 65–99)

## 2017-04-13 NOTE — Care Management Note (Signed)
Case Management Note  Patient Details  Name: Keith JanusGary  King MRN: [id number] Date of Birth: 12/08/1950  Subjective/Objective:  Discharging today.                   Action/Plan: [facility] notified of discharge. Cost of Lovenox is $ 85.19. Patient updated and denies issues paying for medication. DME delivered.   Expected Discharge Date:  04/13/17               Expected Discharge Plan:  Home w Home Health Services  In-House Referral:     Discharge planning Services  CM Consult  Post Acute Care Choice:  Home Health, Durable Medical Equipment Choice offered to:  Patient  DME Arranged:  Bedside commode, Walker rolling DME Agency:  [facility]  [facility]  Status of Service:  Completed, signed off  If discussed at Long Length of Stay Meetings, dates discussed:    Additional Comments:  Marily MemosLisa M Aideliz Garmany, RN 04/13/2017, 9:21 AM

## 2017-04-13 NOTE — Progress Notes (Signed)
   Subjective: 3 Days Post-Op Procedure(s) (LRB): TOTAL KNEE ARTHROPLASTY (Left) Patient reports pain as 5 on 0-10 scale.   Patient is well, and has had no acute complaints or problems. Calf pain and knee pain improving. Swelling improving. Denies any CP, SOB, ABD pain. We will continue therapy today.    Objective: Vital signs in last 24 hours: Temp:  [97.8 F (36.6 C)-99.1 F (37.3 C)] 99 F (37.2 C) (12/06 2002) Pulse Rate:  [95-104] 104 (12/06 2002) Resp:  [18-20] 18 (12/06 2002) BP: (131-161)/(54-74) 131/54 (12/06 2002) SpO2:  [92 %-97 %] 97 % (12/06 2002)  Intake/Output from previous day: 12/06 0701 - 12/07 0700 In: 840 [P.O.:840] Out: 550 [Urine:550] Intake/Output this shift: Total I/O In: 240 [P.O.:240] Out: -   Recent Labs    04/10/17 1514 04/11/17 0558  HGB 15.2 14.3   Recent Labs    04/10/17 1514 04/11/17 0558  WBC 11.0* 10.9*  RBC 4.95 4.62  HCT 45.7 42.8  PLT 235 235   Recent Labs    04/10/17 1514 04/11/17 0558  NA  --  134*  K  --  4.0  CL  --  102  CO2  --  25  BUN  --  18  CREATININE 0.90 0.97  GLUCOSE  --  169*  CALCIUM  --  7.8*   No results for input(s): LABPT, INR in the last 72 hours.  EXAM General - Patient is Alert, Appropriate and Oriented Extremity - Neurovascular intact Sensation intact distally Intact pulses distally Dorsiflexion/Plantar flexion intact No cellulitis present Compartment soft  - homans Dressing - dressing C/D/I and no drainage Motor Function - intact, moving foot and toes well on exam.   Past Medical History:  Diagnosis Date  . Arthritis   . Asthma    cold weather and mold trigger attacks  . Complication of anesthesia    gas makes me sick  . Coronary artery disease   . Diabetes mellitus without complication (HCC)   . GERD (gastroesophageal reflux disease)   . History of kidney stones   . Hypercholesterolemia   . Hypertension   . PONV (postoperative nausea and vomiting)    gas makes me sick   . Sleep apnea    uses CPAP    Assessment/Plan:   3 Days Post-Op Procedure(s) (LRB): TOTAL KNEE ARTHROPLASTY (Left) Active Problems:   Primary osteoarthritis of left knee  Estimated body mass index is 30.61 kg/m as calculated from the following:   Height as of this encounter: 6\' 1"  (1.854 m).   Weight as of this encounter: 105.2 kg (232 lb). Advance diet Up with therapy  Needs BM Discharge today pending BM. Patient wanting to discharge home with HHPT. PT needing to make more progress with PT.  DVT Prophylaxis - Lovenox, Foot Pumps and TED hose Weight-Bearing as tolerated to left leg   T. Cranston Neighborhris Jaydenn Boccio, PA-C [facility] 04/13/2017, 6:32 AM

## 2017-04-13 NOTE — Progress Notes (Signed)
Physical Therapy Treatment Patient Details Name: Keith JanusGary [place] King MRN: [id number] DOB: [birth year] Today's Date: 04/13/2017    History of Present Illness 66 y/o M s/p L TKA 04/10/17.    PT Comments    Pt eager to work with PT and do what he can but is pain limited and is struggling with ROM (had considerable ROM limitations pre surgery).  Pt needed a lot of extra time/encouragement/reinforcement with most exercises/movement/activity and PT spent a good bit of time educating on progression and expectations for ROM/exercises/ambulation/mobility and encouraging pt to continue to work hard and look at every day as a 24/7 opportunity to work on quad control/ROM/WBing.  Pt pleasant and hard working t/o session, able to walk father/better today w/o KI donned.     Follow Up Recommendations  SNF     Equipment Recommendations  Rolling walker with 5" wheels    Recommendations for Other Services       Precautions / Restrictions Precautions Precautions: Fall;Knee Precaution Booklet Issued: Yes (comment) Restrictions LLE Weight Bearing: Weight bearing as tolerated    Mobility  Bed Mobility               General bed mobility comments: Pt up in recliner, mobility not addressed this session  Transfers Overall transfer level: Modified independent Equipment used: Rolling walker (2 wheeled) Transfers: Sit to/from Stand Sit to Stand: Min guard         General transfer comment: Pt again needing a lot of cuing to insure appropriate set up/positioning and ultimately needed extra time and effort to get to standing (though he did not need direct physical assist)  Ambulation/Gait Ambulation/Gait assistance: Min guard Ambulation Distance (Feet): 150 Feet Assistive device: Rolling walker (2 wheeled)       General Gait Details: Very slow/guarded ambulation that did improve with time.  He initially was not able to fully get heel to the ground but with cuing/encouragement did ultimately  get heel down regularly.  He was highly reliant on the walker and reported considerable UE fatigue with prolonged ambulation.  Pt needing frequent breif standing rest breaks and struggled when PT advanced the walker consistently and "forced" increased WBing on the L.    Stairs Stairs: Yes   Stair Management: Backwards;With walker Number of Stairs: 3 General stair comments: Pt able to go up/down steps with cautious backward strategy and constant cuing.  Pt able to rise w/o direct phyiscal assist and showed increased confidence and tolerance with WBing.   Wheelchair Mobility    Modified Rankin (Stroke Patients Only)       Balance Overall balance assessment: Modified Independent                                          Cognition Arousal/Alertness: Awake/alert Behavior During Therapy: WFL for tasks assessed/performed Overall Cognitive Status: Within Functional Limits for tasks assessed                                        Exercises Total Joint Exercises Ankle Circles/Pumps: AROM;10 reps Quad Sets: Strengthening;10 reps Gluteal Sets: Strengthening;10 reps Short Arc Quad: 15 reps;AAROM;AROM Heel Slides: AAROM;PROM;10 reps Hip ABduction/ADduction: Strengthening;10 reps Straight Leg Raises: AAROM;10 reps Knee Flexion: PROM;15 reps Goniometric ROM: 8-83 (after extensive but cautious PROM)    General Comments  Pertinent Vitals/Pain Pain Assessment: 0-10 Pain Score: 5  Pain Location: anterior and posterior knee pain    Home Living                      Prior Function            PT Goals (current goals can now be found in the care plan section) Progress towards PT goals: Progressing toward goals    Frequency    BID      PT Plan Current plan remains appropriate    Co-evaluation              AM-PAC PT "6 Clicks" Daily Activity  Outcome Measure  Difficulty turning over in bed (including adjusting  bedclothes, sheets and blankets)?: Unable Difficulty moving from lying on back to sitting on the side of the bed? : Unable Difficulty sitting down on and standing up from a chair with arms (e.g., wheelchair, bedside commode, etc,.)?: A Little Help needed moving to and from a bed to chair (including a wheelchair)?: A Little Help needed walking in hospital room?: A Little Help needed climbing 3-5 steps with a railing? : A Little 6 Click Score: 14    End of Session Equipment Utilized During Treatment: Gait belt Activity Tolerance: Patient tolerated treatment well Patient left: in chair;with call bell/phone within reach;with chair alarm set;with family/visitor present;with SCD's reapplied;Other (comment)   PT Visit Diagnosis: Pain;Unsteadiness on feet (R26.81);Muscle weakness (generalized) (M62.81);Other abnormalities of gait and mobility (R26.89) Pain - Right/Left: Left Pain - part of body: Knee     Time: [id number] PT Time Calculation (min) (ACUTE ONLY): 73 min  Charges:  $Gait Training: 23-37 mins $Therapeutic Exercise: 38-52 mins                    G Codes:       Malachi ProGalen R Arrian Manson, DPT 04/13/2017, 11:43 AM

## 2017-04-13 NOTE — Progress Notes (Signed)
Patient is being discharged home. IV removed with cath intact. Reviewed meds, scripts, and last dose given. Extra dressing sent with. Wife was able to inject Lovenox without difficulty. Allowed time for questions.

## 2017-04-25 ENCOUNTER — Other Ambulatory Visit: Payer: Self-pay | Admitting: Orthopedic Surgery

## 2017-04-25 ENCOUNTER — Ambulatory Visit
Admission: RE | Admit: 2017-04-25 | Discharge: 2017-04-25 | Disposition: A | Discharge status: Home / Self Care | Payer: Medicare HMO | Source: Ambulatory Visit | Attending: Orthopedic Surgery | Admitting: Orthopedic Surgery

## 2017-04-25 ENCOUNTER — Ambulatory Visit: Payer: Medicare HMO

## 2017-04-25 DIAGNOSIS — Z96652 Presence of left artificial knee joint: Secondary | ICD-10-CM | POA: Insufficient documentation

## 2018-07-06 IMAGING — US US EXTREM LOW VENOUS*L*
1 series · 14 of 24 positions shown · non-contrast
Comparison: None

CLINICAL DATA: Pain and swelling after knee replacement surgery

EXAM:
LEFT LOWER EXTREMITY VENOUS DOPPLER ULTRASOUND
TECHNIQUE: Gray-scale sonography with compression, as well as color and duplex
ultrasound, were performed to evaluate the deep venous system from
the level of the common femoral vein through the popliteal and
proximal calf veins.

[Series 1: us extrem low venous*left* · 0.08mm/px · 14 of 46 slices shown]
[im 1/46]
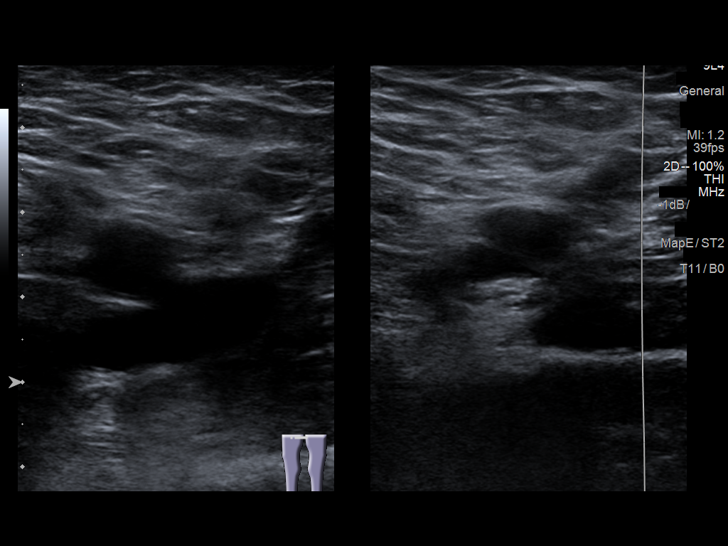
[im 4/46]
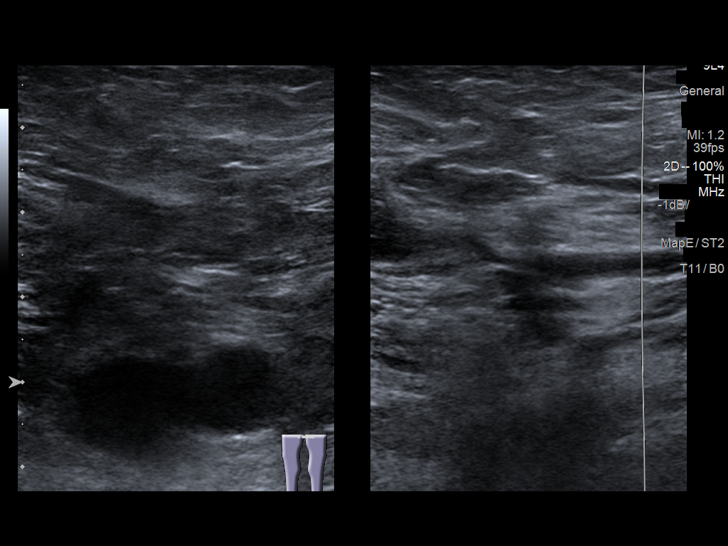
[im 8/46]
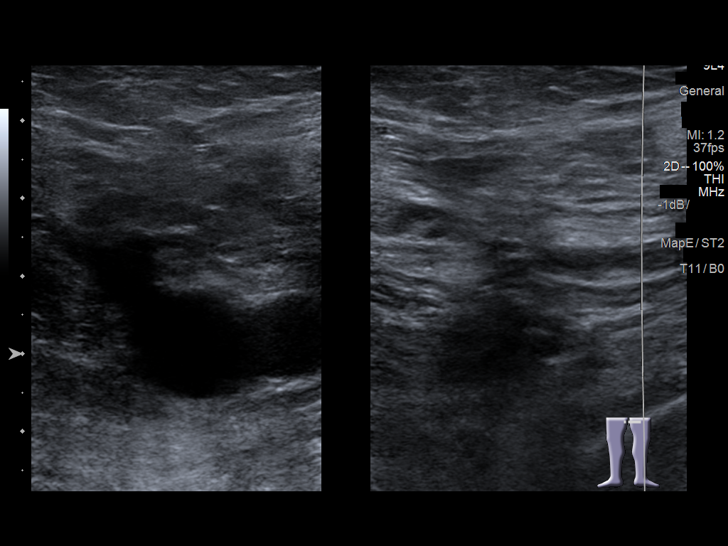
[im 12/46]
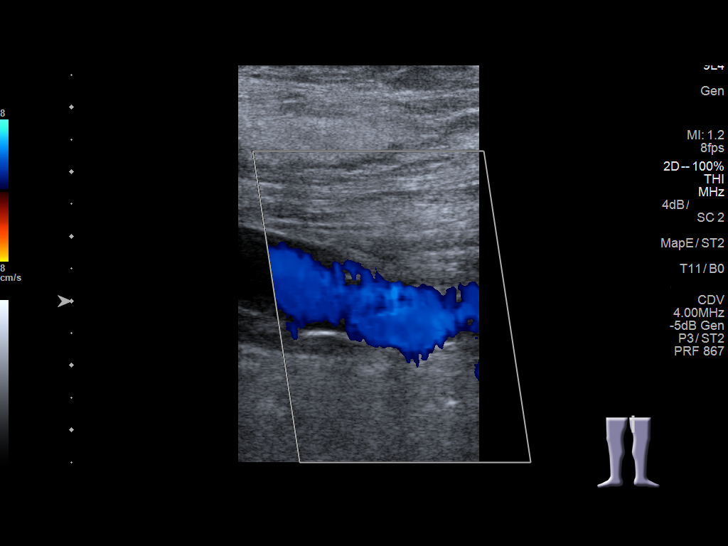
[im 14/46]
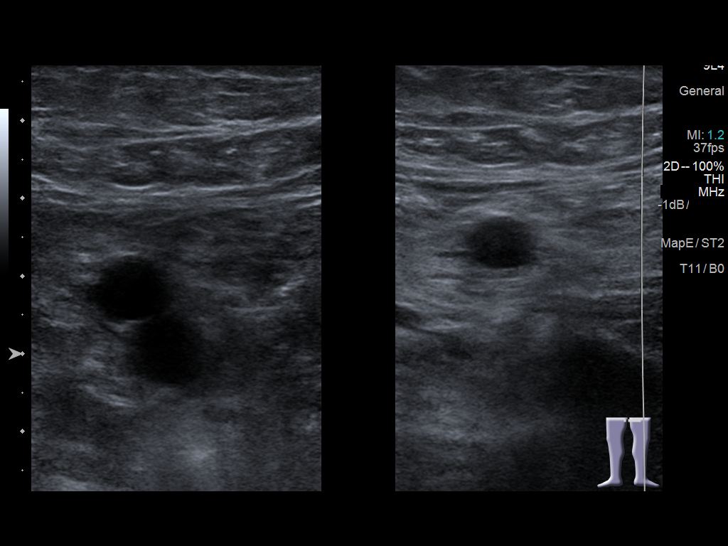
[im 18/46]
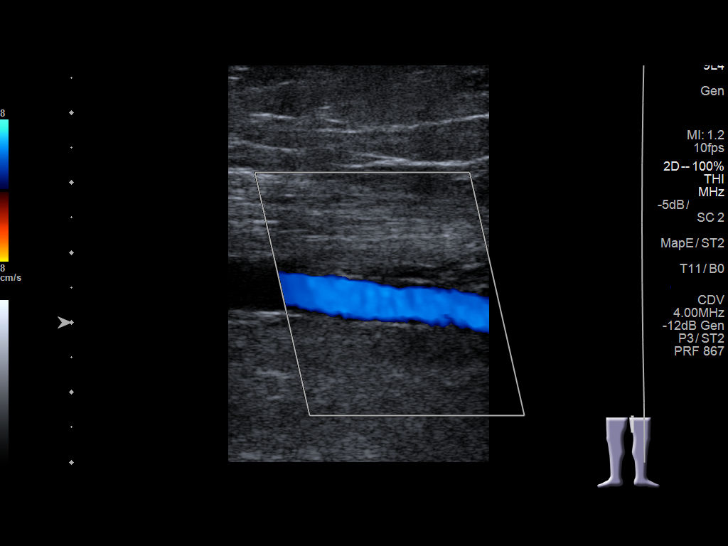
[im 22/46]
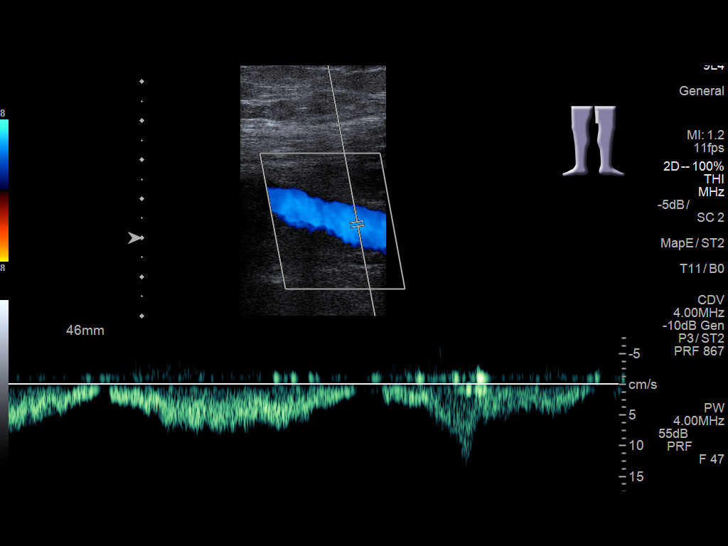
[im 24/46]
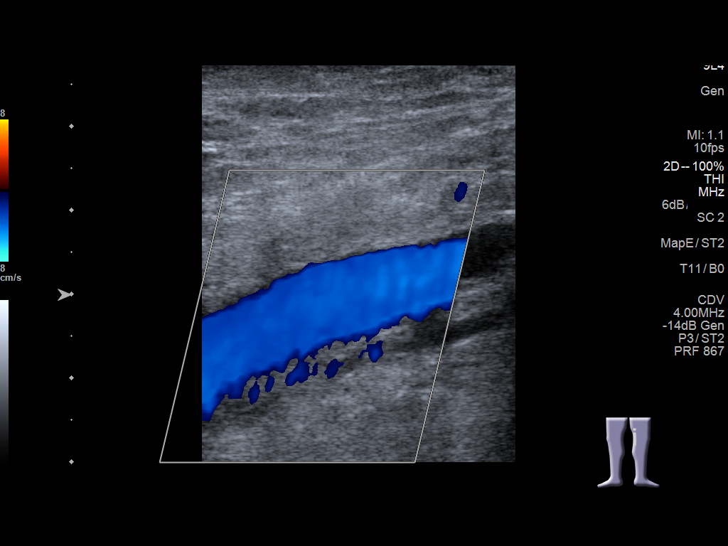
[im 28/46]
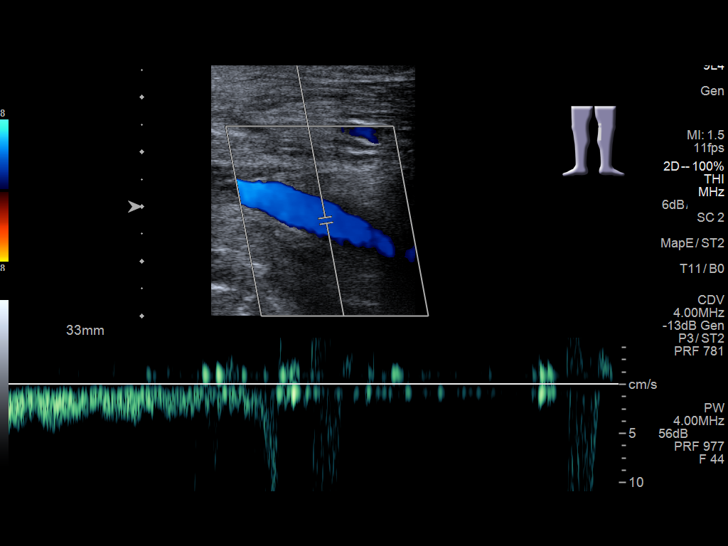
[im 32/46]
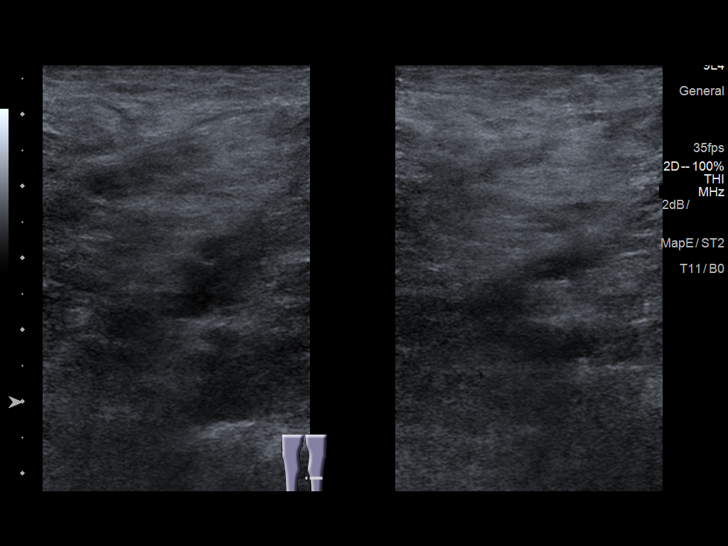
[im 36/46]
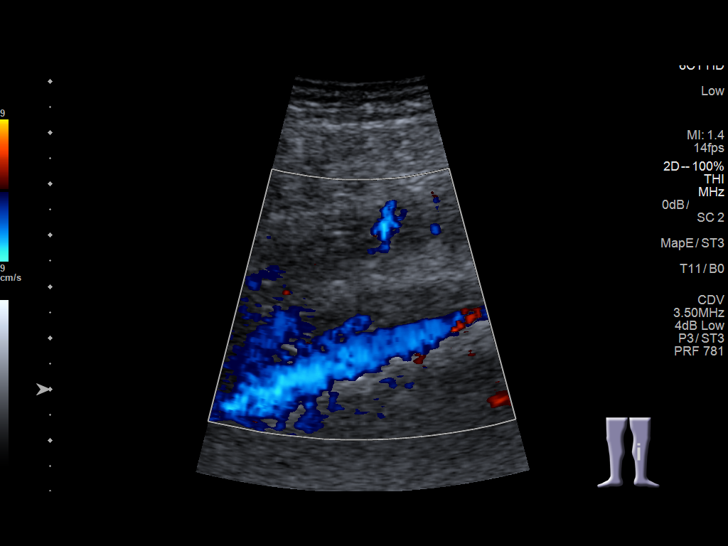
[im 38/46]
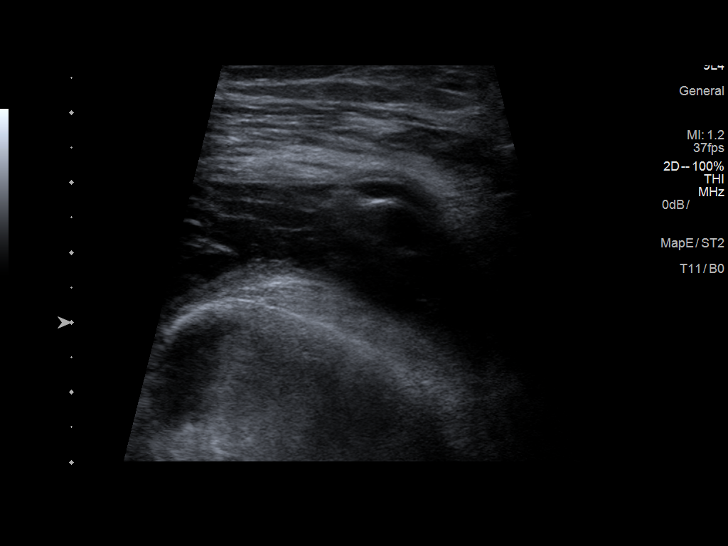
[im 42/46]
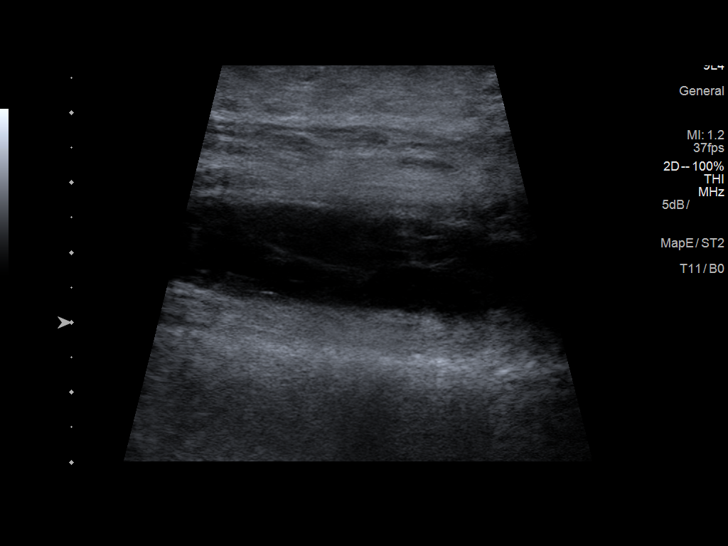
[im 46/46]
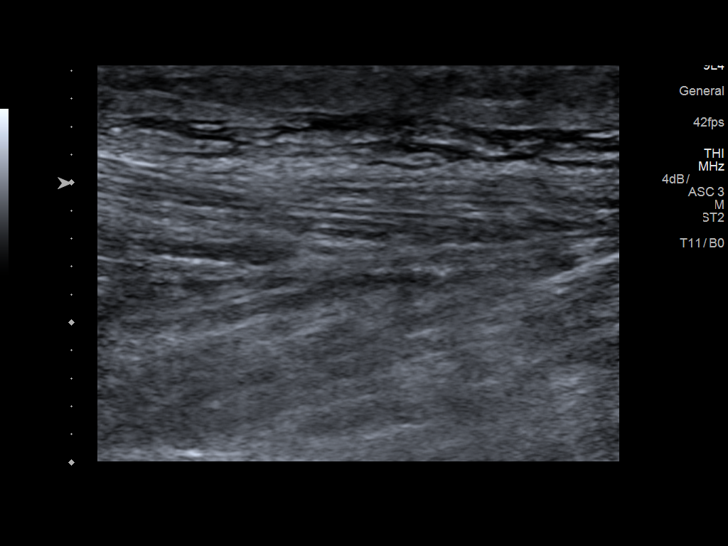

[14 of 24 positions shown; findings below may reference images not displayed]

FINDINGS: Normal compressibility of the common femoral, superficial femoral,
and popliteal veins, as well as the proximal calf veins. No filling
defects to suggest DVT on grayscale or color Doppler imaging.
Doppler waveforms show normal direction of venous flow, normal
respiratory phasicity and response to augmentation.
IMPRESSION: No evidence of  lower extremity deep vein thrombosis, LEFT.

## 2019-07-22 IMAGING — CT CT KNEE*L* W/O CM
1 of 3 series · 7 of 14 positions shown, 9 images · non-contrast
Comparison: None.

CLINICAL DATA: Primary osteoarthritis of the left knee. Preop for
knee surgery.

EXAM:
CT OF THE LEFT KNEE WITHOUT CONTRAST
TECHNIQUE: Multidetector CT imaging of the left knee was performed according to
the MY KNEE protocol. Multiplanar CT image reconstructions were also
generated.

[Series 5: axial st · axial · 0.35mm/px · z∈[+317,+541]mm · 7 of 300 slices shown, 9 images]
[im 38/300  soft-tissue]
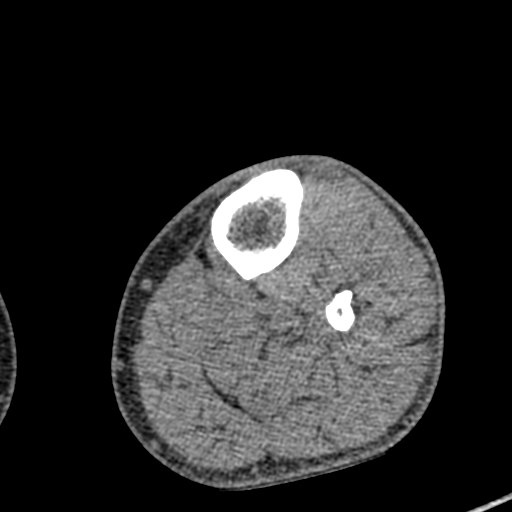
[im 38/300  bone]
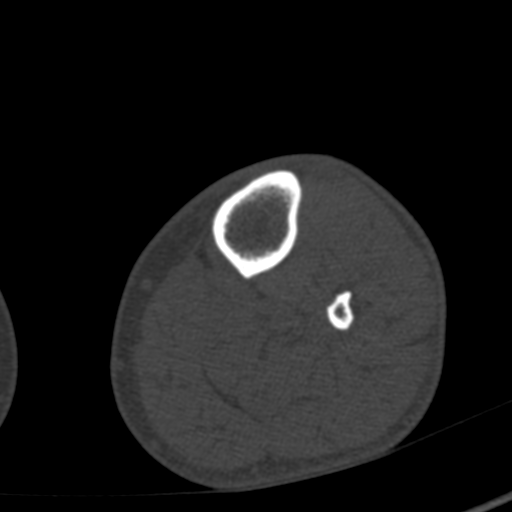
[im 75/300  bone]
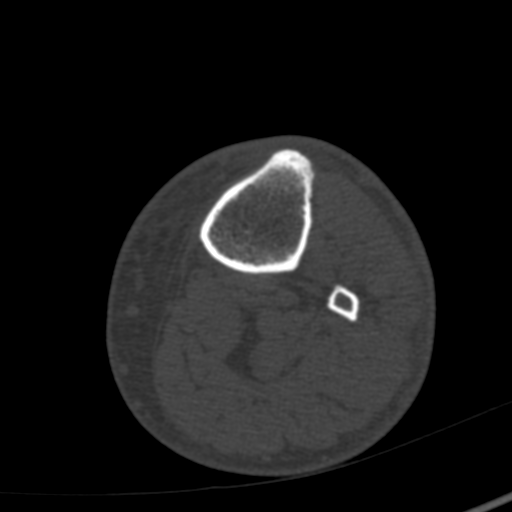
[im 113/300  bone]
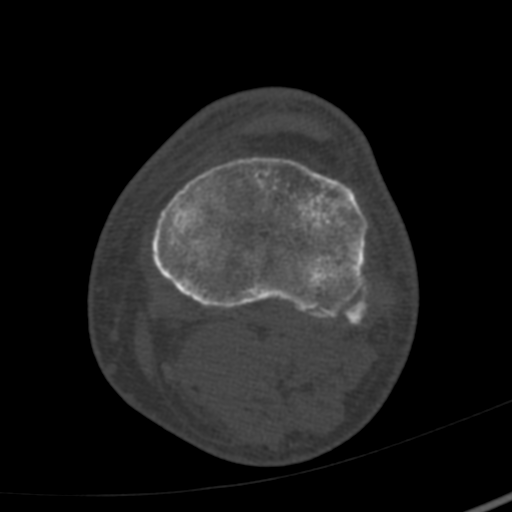
[im 150/300  bone]
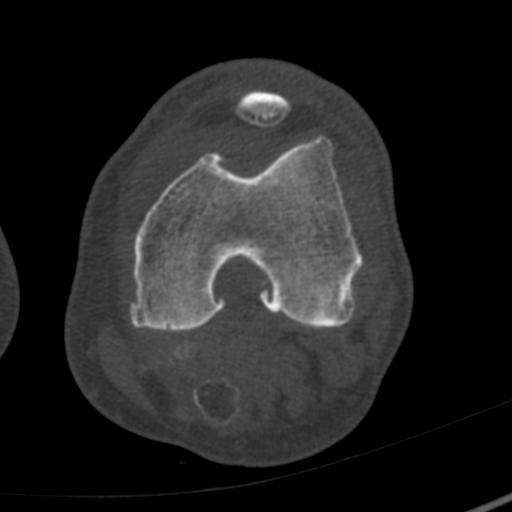
[im 187/300  soft-tissue]
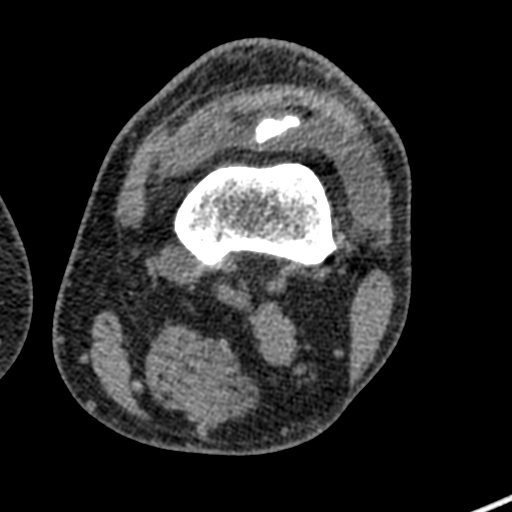
[im 187/300  bone]
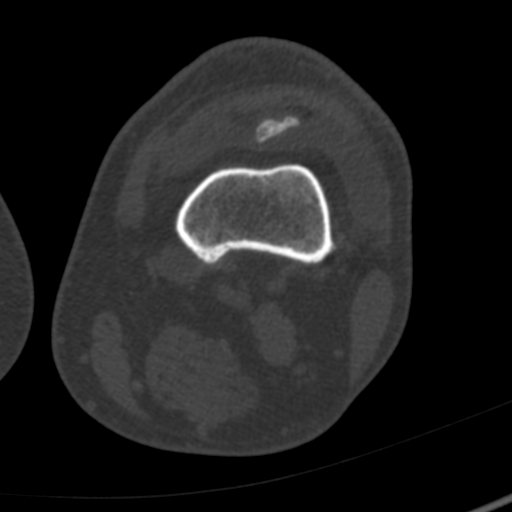
[im 225/300  bone]
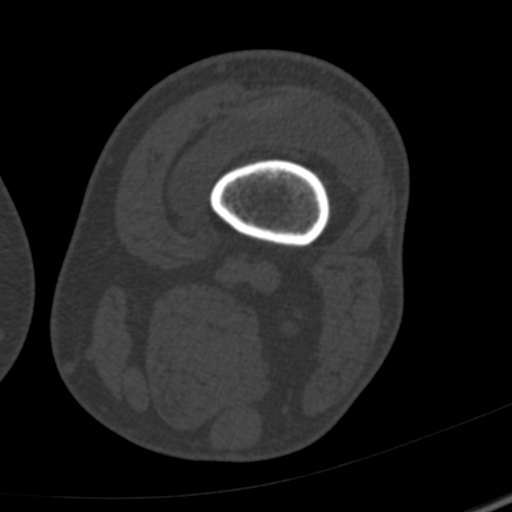
[im 262/300  bone]
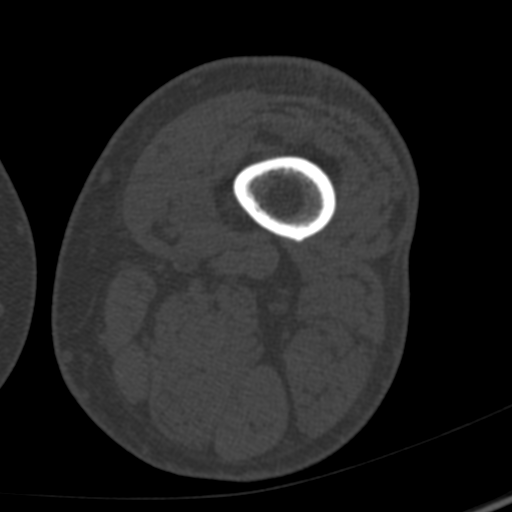

[7 of 14 positions shown; findings below may reference images not displayed]

FINDINGS: Bones/Joint/Cartilage

Left hip: No joint effusion, fracture or suspicious osseous lesions.
No periarticular muscle atrophy or soft tissue mass.

Left ankle: Periarticular subcutaneous soft tissue edema more so
along the medial aspect overlying the medial malleolus. The
tibiotalar and subtalar joints are maintained. No acute nor
suspicious osseous abnormalities. Bone island of the cuboid.
Degenerative subcortical cystic change along the plantar posterior
aspect of the lateral cuneiform.

Left knee: Moderate suprapatellar joint effusion with faint
suprapatellar densities along the upper margin of the joint capsule
that may represent stigmata of a synovitis. Three ossifications are
seen within the expected location of a popliteal cyst, range in size
from 14 mm in diameter through 18 mm consistent with synovial
osteochondromatosis. There is moderate joint space narrowing of the
femorotibial compartment with spurring off the femoral condyles and
tibial plateau. Subchondral sclerosis from osteoarthritis is noted
along the weight-bearing portion of the lateral femoral condyles as
well as tibial plateau. Subchondral cystic change are seen deep to
the tibial spine and medial tibial plateau posteriorly.
Osteoarthritis of the proximal tibiofibular syndesmosis. Mild
spurring off the upper and lower pole of the patella.

Ligaments

Suboptimally assessed by CT.

Muscles and Tendons

Intact extensor mechanism tendons. No significant muscle atrophy. No
intramuscular mass or hemorrhage.

Soft tissues

No significant soft tissue swelling about the knee.
IMPRESSION: 1. Moderate femorotibial and patellofemoral joint osteoarthritis
with subchondral sclerosis and spurring about the femorotibial
compartment. Subchondral cystic change of the tibial plateau also in
keeping with osteoarthritis.
2. Moderate suprapatellar joint effusion with faint soft tissue
mineralization that may reflect stigmata of synovitis.
3. Loose bodies noted in the expected location of a popliteal cyst
numbering 3 in total and ranging in size from 14 mm to 18 mm.
4. Mild-to-moderate soft tissue swelling about the included left
ankle.
